# Patient Record
Sex: Female | Born: 1950 | Race: Black or African American | Hispanic: No | Smoking: Never smoker
Health system: Southern US, Community
[De-identification: ages and names within clinical notes are randomized; demographics above are authoritative.]

## PROBLEM LIST (undated history)

## (undated) DIAGNOSIS — J449 Chronic obstructive pulmonary disease, unspecified: Secondary | ICD-10-CM

## (undated) DIAGNOSIS — I10 Essential (primary) hypertension: Secondary | ICD-10-CM

## (undated) DIAGNOSIS — M1990 Unspecified osteoarthritis, unspecified site: Secondary | ICD-10-CM

## (undated) DIAGNOSIS — I509 Heart failure, unspecified: Secondary | ICD-10-CM

## (undated) DIAGNOSIS — R269 Unspecified abnormalities of gait and mobility: Secondary | ICD-10-CM

## (undated) HISTORY — PX: CHOLECYSTECTOMY: SHX55

## (undated) HISTORY — DX: Unspecified osteoarthritis, unspecified site: M19.90

## (undated) HISTORY — DX: Chronic obstructive pulmonary disease, unspecified: J44.9

## (undated) HISTORY — PX: CARDIAC SURGERY: SHX584

## (undated) HISTORY — PX: ABDOMINAL HYSTERECTOMY: SHX81

## (undated) HISTORY — PX: BACK SURGERY: SHX140

## (undated) HISTORY — PX: COLON SURGERY: SHX602

## (undated) HISTORY — DX: Unspecified abnormalities of gait and mobility: R26.9

## (undated) HISTORY — DX: Essential (primary) hypertension: I10

## (undated) HISTORY — DX: Heart failure, unspecified: I50.9

---

## 1998-06-03 ENCOUNTER — Emergency Department (HOSPITAL_COMMUNITY): Admission: EM | Admit: 1998-06-03 | Discharge: 1998-06-03 | Payer: Self-pay | Admitting: Emergency Medicine

## 2004-07-29 ENCOUNTER — Ambulatory Visit (HOSPITAL_BASED_OUTPATIENT_CLINIC_OR_DEPARTMENT_OTHER): Admission: RE | Admit: 2004-07-29 | Discharge: 2004-07-29 | Payer: Self-pay | Admitting: *Deleted

## 2004-07-29 ENCOUNTER — Ambulatory Visit (HOSPITAL_COMMUNITY): Admission: RE | Admit: 2004-07-29 | Discharge: 2004-07-29 | Payer: Self-pay | Admitting: *Deleted

## 2004-11-03 ENCOUNTER — Encounter: Admission: RE | Admit: 2004-11-03 | Discharge: 2004-11-03 | Payer: Self-pay | Admitting: Neurological Surgery

## 2004-12-10 ENCOUNTER — Inpatient Hospital Stay (HOSPITAL_COMMUNITY): Admission: RE | Admit: 2004-12-10 | Discharge: 2004-12-15 | Payer: Self-pay | Admitting: Neurological Surgery

## 2005-01-11 ENCOUNTER — Encounter: Admission: RE | Admit: 2005-01-11 | Discharge: 2005-01-11 | Payer: Self-pay | Admitting: Neurological Surgery

## 2005-03-29 ENCOUNTER — Encounter: Admission: RE | Admit: 2005-03-29 | Discharge: 2005-03-29 | Payer: Self-pay | Admitting: Neurological Surgery

## 2005-05-03 ENCOUNTER — Encounter: Admission: RE | Admit: 2005-05-03 | Discharge: 2005-05-03 | Payer: Self-pay | Admitting: Neurological Surgery

## 2005-08-09 ENCOUNTER — Encounter: Admission: RE | Admit: 2005-08-09 | Discharge: 2005-08-09 | Payer: Self-pay | Admitting: Neurological Surgery

## 2012-08-08 HISTORY — PX: JOINT REPLACEMENT: SHX530

## 2013-01-05 DIAGNOSIS — M1990 Unspecified osteoarthritis, unspecified site: Secondary | ICD-10-CM

## 2013-01-05 HISTORY — DX: Unspecified osteoarthritis, unspecified site: M19.90

## 2015-04-22 DIAGNOSIS — I509 Heart failure, unspecified: Secondary | ICD-10-CM

## 2015-04-22 HISTORY — DX: Heart failure, unspecified: I50.9

## 2015-07-05 LAB — BASIC METABOLIC PANEL
BUN: 28 mg/dL — AB (ref 4–21)
Creatinine: 0.8 mg/dL (ref <=1.1)
Glucose: 9 mg/dL
Potassium: 4.4 mmol/L (ref 3.4–5.3)
Sodium: 142 mmol/L (ref 137–147)

## 2015-07-07 LAB — POCT INR: INR: 1.4 — AB (ref <=1.1)

## 2015-07-08 LAB — CBC AND DIFFERENTIAL
HCT: 27 % — AB (ref 36–46)
Hemoglobin: 8.4 g/dL — AB (ref 12.0–16.0)
Platelets: 366 10*3/uL (ref 150–399)
WBC: 1 10^3/mL

## 2015-07-08 LAB — POCT INR: INR: 1.3 — AB (ref <=1.1)

## 2015-07-08 LAB — BASIC METABOLIC PANEL
BUN: 15 mg/dL (ref 4–21)
Creatinine: 0.8 mg/dL (ref <=1.1)
Potassium: 4.2 mmol/L (ref 3.4–5.3)
Sodium: 138 mmol/L (ref 137–147)

## 2015-07-09 LAB — POCT INR: INR: 1.5 — AB (ref <=1.1)

## 2015-07-10 LAB — CBC AND DIFFERENTIAL
HCT: 26 % — AB (ref 36–46)
Hemoglobin: 8.2 g/dL — AB (ref 12.0–16.0)
Platelets: 406 10*3/uL — AB (ref 150–399)
WBC: 9.9 10^3/mL

## 2015-07-10 LAB — POCT INR: INR: 1.6 — AB (ref <=1.1)

## 2015-07-11 LAB — CBC AND DIFFERENTIAL
HCT: 32 % — AB (ref 36–46)
Hemoglobin: 10.1 g/dL — AB (ref 12.0–16.0)
Platelets: 391 10*3/uL (ref 150–399)
WBC: 14.5 10^3/mL

## 2015-07-11 LAB — POCT INR: INR: 2 — AB (ref <=1.1)

## 2015-07-15 ENCOUNTER — Encounter: Payer: Self-pay | Admitting: *Deleted

## 2015-07-16 ENCOUNTER — Encounter: Payer: Self-pay | Admitting: Adult Health

## 2015-07-16 ENCOUNTER — Non-Acute Institutional Stay (SKILLED_NURSING_FACILITY): Payer: Medicare HMO | Admitting: Adult Health

## 2015-07-16 DIAGNOSIS — D62 Acute posthemorrhagic anemia: Secondary | ICD-10-CM

## 2015-07-16 DIAGNOSIS — I480 Paroxysmal atrial fibrillation: Secondary | ICD-10-CM | POA: Diagnosis not present

## 2015-07-16 DIAGNOSIS — J449 Chronic obstructive pulmonary disease, unspecified: Secondary | ICD-10-CM | POA: Diagnosis not present

## 2015-07-16 DIAGNOSIS — I340 Nonrheumatic mitral (valve) insufficiency: Secondary | ICD-10-CM | POA: Diagnosis not present

## 2015-07-16 DIAGNOSIS — I4891 Unspecified atrial fibrillation: Secondary | ICD-10-CM | POA: Insufficient documentation

## 2015-07-16 DIAGNOSIS — E876 Hypokalemia: Secondary | ICD-10-CM

## 2015-07-16 DIAGNOSIS — I5022 Chronic systolic (congestive) heart failure: Secondary | ICD-10-CM

## 2015-07-16 DIAGNOSIS — I10 Essential (primary) hypertension: Secondary | ICD-10-CM | POA: Diagnosis not present

## 2015-07-16 NOTE — Progress Notes (Signed)
Patient ID: Shannon Mcclain, female   DOB: [birth year], 64 y.o.   MRN: [id number]    DATE:  07/16/2015 MRN:  [id number]  BIRTHDAY: 02/10/1951  Facility:  Nursing Home Location:  [facility]  Nursing Home Room Number: 902-P  LEVEL OF CARE:  SNF (31)  Contact Information    Name Relation Home Work Mobile   Carroll Sage  [phone]        Chief Complaint  Patient presents with  . Hospitalization Follow-up    Physical deconditioning, S/P mitral valve replacement, atrial fibrillation, hypertension, anemia, hypokalemia, CHF and COPD    HISTORY OF PRESENT ILLNESS:  This is a 64 year old female who has been admitted to [facility] on 07/15/15 from [facility]. She had mitral valve replacement with a tissue valve on 06/30/15. She uses O2 at home at 2 L. She had rate-controlled atrial fibrillation on postoperative day 2. She had bradycardia on postop day 3 requiring been particular pacing with a temporary pacemaker. Cardiology recommended cardioversion and was done on 7/22. It was initially successful but she is even 20 converted back to a rate controlled atrial fibrillation. She then converted to intermittent atrial fibrillation with some bigeminy and eventually remained sinus rhythm.  She has been admitted for a short-term rehabilitation.  PAST MEDICAL HISTORY:  Past Medical History  Diagnosis Date  . Abnormality of gait   . COPD (chronic obstructive pulmonary disease)   . Hypertension   . Arthritis 01/05/2013    left  . CHF (congestive heart failure) 04/22/2015    NYHA class II, chronic, systolic     CURRENT MEDICATIONS: Reviewed  Patient's Medications  New Prescriptions   No medications on file  Previous Medications   AMIODARONE (PACERONE) 200 MG TABLET    Take 200 mg by mouth every 12 (twelve) hours.   ASPIRIN EC 81 MG TABLET    Take 81 mg by mouth daily.   CARVEDILOL (COREG) 12.5 MG TABLET    Take 12.5 mg by mouth 2 (two) times daily with a meal.   FAMOTIDINE (PEPCID) 20 MG TABLET    Take 20 mg by mouth 2 (two) times daily.   FLUTICASONE FUROATE-VILANTEROL 100-25 MCG/INH AEPB    Inhale 1 puff into the lungs daily.   FUROSEMIDE (LASIX) 20 MG TABLET    Take 20 mg by mouth daily.   LOSARTAN (COZAAR) 50 MG TABLET    Take 50 mg by mouth 2 (two) times daily.   OXYCODONE (OXY IR/ROXICODONE) 5 MG IMMEDIATE RELEASE TABLET    Take 5 mg by mouth every 4 (four) hours as needed for severe pain.   POTASSIUM CHLORIDE (K-DUR,KLOR-CON) 10 MEQ TABLET    Take 10 mEq by mouth daily.   WARFARIN (COUMADIN) 5 MG TABLET    Take 2.5 mg by mouth daily.   Modified Medications   No medications on file  Discontinued Medications   AMIODARONE (PACERONE) 400 MG TABLET    Take 200 mg by mouth every 12 (twelve) hours.    ASPIRIN 325 MG TABLET    Take 325 mg by mouth daily.   BISACODYL (DULCOLAX) 10 MG SUPPOSITORY    Place 10 mg rectally daily as needed for moderate constipation.   DOCUSATE SODIUM (COLACE) 100 MG CAPSULE    Take 100 mg by mouth 2 (two) times daily.   ENOXAPARIN (LOVENOX) 80 MG/0.8ML INJECTION    Inject 80 mg into the skin every 12 (twelve) hours.   FUROSEMIDE (LASIX) 40 MG TABLET  Take 20 mg by mouth daily.    MAGNESIUM CITRATE PO    Take 150 mLs by mouth daily.   POTASSIUM CHLORIDE (KLOR-CON) 20 MEQ PACKET    Take 10 mEq by mouth daily.    SODIUM CHLORIDE IRRIGATION 0.9 % IRRIGATION    Irrigate with 10 mLs as directed once.     Allergies  Allergen Reactions  . Codeine   . Penicillins   . Sulfa Antibiotics      REVIEW OF SYSTEMS:  GENERAL: no change in appetite, no fatigue, no weight changes, no fever, chills or weakness EYES: Denies change in vision, dry eyes, eye pain, itching or discharge EARS: Denies change in hearing, ringing in ears, or earache NOSE: Denies nasal congestion or epistaxis MOUTH and THROAT: Denies oral discomfort, gingival pain or bleeding, pain from teeth or hoarseness   RESPIRATORY: no cough, SOB, DOE, wheezing,  hemoptysis CARDIAC: no chest pain, edema or palpitations GI: no abdominal pain, diarrhea, constipation, heart burn, nausea or vomiting GU: Denies dysuria, frequency, hematuria, incontinence, or discharge PSYTCHIATRIC: Denies feeling of depression or anxiety. No report of hallucinations, insomnia, paranoia, or agitation   PHYSICAL EXAMINATION  GENERAL APPEARANCE: Well nourished. In no acute distress. Normal body habitus SKIN:   Surgical incision on midline chest is dry, no redness HEAD: Normal in size and contour. No evidence of trauma EYES: Lids open and close normally. No blepharitis, entropion or ectropion. PERRL. Conjunctivae are clear and sclerae are white. Lenses are without opacity EARS: Pinnae are normal. Patient hears normal voice tunes of the examiner MOUTH and THROAT: Lips are without lesions. Oral mucosa is moist and without lesions. Tongue is normal in shape, size, and color and without lesions NECK: supple, trachea midline, no neck masses, no thyroid tenderness, no thyromegaly LYMPHATICS: no LAN in the neck, no supraclavicular LAN RESPIRATORY: breathing is even & unlabored, BS CTAB CARDIAC: RRR, no murmur,no extra heart sounds, no edema GI: abdomen soft, normal BS, no masses, no tenderness, no hepatomegaly, no splenomegaly EXTREMITIES:  Able to move 4 extremities PSYCHIATRIC: Alert and oriented X 3. Affect and behavior are appropriate  LABS/RADIOLOGY: Labs reviewed: Basic Metabolic Panel:  Recent Labs  16/10/96 07/08/15  NA 142 138  K 4.4 4.2  BUN 28* 15  CREATININE 0.8 0.8   CBC:  Recent Labs  07/08/15 07/10/15 07/11/15  WBC 1.0 9.9 14.5  HGB 8.4* 8.2* 10.1*  HCT 27* 26* 32*  PLT 366 406* 391    ASSESSMENT/PLAN:  Physical deconditioning - for rehabilitation  Mitral regurgitation S/P mitral valve replacement - continue oxycodone 5 mg 1 tab by mouth every 4 hours when necessary for pain; follow-up with Dr. Gwen Her, [facility]  [facility])  Atrial fibrillation - rate controlled; continue amiodarone 200 mg 1 tab by mouth every 12 hours, Coreg 12.5 mg by mouth twice a day and Coumadin  Hypertension - well controlled; continue Coreg 12.5 mg 1 tab by mouth twice a day and Cozaar 50 mg 1 tab by mouth daily  Acute blood loss anemia - hemoglobin 10.1; check CBC  Hypokalemia - K4.2; continue KCl 1 tab by mouth daily  Chronic systolic heart failure - continue Lasix 20 mg 1 tab by mouth daily  COPD - continue Symbicort 180-4.5 g/ACT 2 puffs into lungs twice a day    Goals of care:  Short-term rehabilitation   [facility], NP [facility] [phone]

## 2015-07-17 ENCOUNTER — Encounter (HOSPITAL_COMMUNITY): Payer: Self-pay | Admitting: Emergency Medicine

## 2015-07-17 ENCOUNTER — Emergency Department (HOSPITAL_COMMUNITY)
Admission: EM | Admit: 2015-07-17 | Discharge: 2015-07-17 | Disposition: A | Discharge status: Home / Self Care | Payer: Medicare HMO | Attending: Physician Assistant | Admitting: Physician Assistant

## 2015-07-17 ENCOUNTER — Emergency Department (HOSPITAL_COMMUNITY): Payer: Medicare HMO

## 2015-07-17 DIAGNOSIS — Z79899 Other long term (current) drug therapy: Secondary | ICD-10-CM | POA: Insufficient documentation

## 2015-07-17 DIAGNOSIS — Z7982 Long term (current) use of aspirin: Secondary | ICD-10-CM | POA: Insufficient documentation

## 2015-07-17 DIAGNOSIS — R519 Headache, unspecified: Secondary | ICD-10-CM

## 2015-07-17 DIAGNOSIS — H538 Other visual disturbances: Secondary | ICD-10-CM | POA: Diagnosis not present

## 2015-07-17 DIAGNOSIS — M1990 Unspecified osteoarthritis, unspecified site: Secondary | ICD-10-CM | POA: Insufficient documentation

## 2015-07-17 DIAGNOSIS — Z7901 Long term (current) use of anticoagulants: Secondary | ICD-10-CM | POA: Diagnosis not present

## 2015-07-17 DIAGNOSIS — Z880 Allergy status to penicillin: Secondary | ICD-10-CM | POA: Insufficient documentation

## 2015-07-17 DIAGNOSIS — I10 Essential (primary) hypertension: Secondary | ICD-10-CM | POA: Diagnosis not present

## 2015-07-17 DIAGNOSIS — Z7951 Long term (current) use of inhaled steroids: Secondary | ICD-10-CM | POA: Diagnosis not present

## 2015-07-17 DIAGNOSIS — R51 Headache: Secondary | ICD-10-CM | POA: Diagnosis present

## 2015-07-17 DIAGNOSIS — J449 Chronic obstructive pulmonary disease, unspecified: Secondary | ICD-10-CM | POA: Insufficient documentation

## 2015-07-17 DIAGNOSIS — R110 Nausea: Secondary | ICD-10-CM | POA: Diagnosis not present

## 2015-07-17 DIAGNOSIS — I509 Heart failure, unspecified: Secondary | ICD-10-CM | POA: Diagnosis not present

## 2015-07-17 LAB — URINALYSIS, ROUTINE W REFLEX MICROSCOPIC
Bilirubin Urine: NEGATIVE
Glucose, UA: NEGATIVE mg/dL
Hgb urine dipstick: NEGATIVE
KETONES UR: NEGATIVE mg/dL
Nitrite: NEGATIVE
Protein, ur: 30 mg/dL — AB
Specific Gravity, Urine: 1.019 (ref 1.005–1.030)
UROBILINOGEN UA: 0.2 mg/dL (ref 0.0–1.0)
pH: 5.5 (ref 5.0–8.0)

## 2015-07-17 LAB — BASIC METABOLIC PANEL
Anion gap: 7 (ref 5–15)
BUN: 20 mg/dL (ref 6–20)
CO2: 24 mmol/L (ref 22–32)
CREATININE: 0.93 mg/dL (ref 0.44–1.00)
Calcium: 9 mg/dL (ref 8.9–10.3)
Chloride: 108 mmol/L (ref 101–111)
GFR calc Af Amer: >60 mL/min (ref >60)
Glucose, Bld: 88 mg/dL (ref 65–99)
Potassium: 4.8 mmol/L (ref 3.5–5.1)
Sodium: 139 mmol/L (ref 135–145)

## 2015-07-17 LAB — CBC
HCT: 34.4 % — ABNORMAL LOW (ref 36.0–46.0)
Hemoglobin: 10.8 g/dL — ABNORMAL LOW (ref 12.0–15.0)
MCH: 25.7 pg — ABNORMAL LOW (ref 26.0–34.0)
MCHC: 31.4 g/dL (ref 30.0–36.0)
MCV: 81.7 fL (ref 78.0–100.0)
Platelets: 437 10*3/uL — ABNORMAL HIGH (ref 150–400)
RBC: 4.21 MIL/uL (ref 3.87–5.11)
RDW: 18.7 % — ABNORMAL HIGH (ref 11.5–15.5)
WBC: 8.6 10*3/uL (ref 4.0–10.5)

## 2015-07-17 LAB — I-STAT CHEM 8, ED
BUN: 26 mg/dL — ABNORMAL HIGH (ref 6–20)
CALCIUM ION: 1.2 mmol/L (ref 1.13–1.30)
CHLORIDE: 104 mmol/L (ref 101–111)
Creatinine, Ser: 0.9 mg/dL (ref 0.44–1.00)
GLUCOSE: 85 mg/dL (ref 65–99)
HCT: 36 % (ref 36.0–46.0)
HEMOGLOBIN: 12.2 g/dL (ref 12.0–15.0)
Potassium: 4.6 mmol/L (ref 3.5–5.1)
Sodium: 140 mmol/L (ref 135–145)
TCO2: 26 mmol/L (ref 0–100)

## 2015-07-17 LAB — SEDIMENTATION RATE: SED RATE: 33 mm/h — AB (ref 0–22)

## 2015-07-17 LAB — C-REACTIVE PROTEIN: CRP: 1.8 mg/dL — ABNORMAL HIGH (ref <1.0)

## 2015-07-17 LAB — PROTIME-INR
INR: 1.7 — AB (ref 0.00–1.49)
Prothrombin Time: 20 seconds — ABNORMAL HIGH (ref 11.6–15.2)

## 2015-07-17 LAB — URINE MICROSCOPIC-ADD ON

## 2015-07-17 MED ORDER — IOHEXOL 350 MG/ML SOLN
50.0000 mL | Freq: Once | INTRAVENOUS | Status: AC | PRN
  Administered 2015-07-17: 50 mL via INTRAVENOUS

## 2015-07-17 MED ORDER — FENTANYL CITRATE (PF) 100 MCG/2ML IJ SOLN
100.0000 ug | Freq: Once | INTRAMUSCULAR | Status: AC
  Administered 2015-07-17: 100 ug via INTRAVENOUS
  Filled 2015-07-17: qty 2

## 2015-07-17 MED ORDER — ONDANSETRON HCL 4 MG/2ML IJ SOLN
4.0000 mg | Freq: Once | INTRAMUSCULAR | Status: AC
  Administered 2015-07-17: 4 mg via INTRAVENOUS
  Filled 2015-07-17: qty 2

## 2015-07-17 MED ORDER — AMIODARONE HCL 200 MG PO TABS: 200.0000 mg | ORAL_TABLET | Freq: Once | ORAL | Status: DC

## 2015-07-17 MED ORDER — CARVEDILOL 12.5 MG PO TABS: 12.5000 mg | ORAL_TABLET | Freq: Two times a day (BID) | ORAL | Status: DC

## 2015-07-17 MED ORDER — SODIUM CHLORIDE 0.9 % IV BOLUS (SEPSIS)
500.0000 mL | Freq: Once | INTRAVENOUS | Status: AC
  Administered 2015-07-17: 500 mL via INTRAVENOUS

## 2015-07-17 NOTE — Discharge Instructions (Signed)
You are having a headache. No specific cause was found today for your headache. It may have been a migraine or other cause of headache. Stress, anxiety, fatigue, and depression are common triggers for headaches. Your headache today does not appear to be life-threatening or require hospitalization, but often the exact cause of headaches is not determined in the emergency department. Therefore, follow-up with your doctor is very important to find out what may have caused your headache, and whether or not you need any further diagnostic testing or treatment. Sometimes headaches can appear benign (not harmful), but then more serious symptoms can develop which should prompt an immediate re-evaluation by your doctor or the emergency department. SEEK MEDICAL ATTENTION IF: You develop possible problems with medications prescribed.  The medications don't resolve your headache, if it recurs , or if you have multiple episodes of vomiting or can't take fluids. You have a change from the usual headache. RETURN IMMEDIATELY IF you develop a sudden, severe headache or confusion, become poorly responsive or faint, develop a fever above 100.32F or problem breathing, have a change in speech, vision, swallowing, or understanding, or develop new weakness, numbness, tingling, incoordination, or have a seizure.    Hypertension Hypertension, commonly called high blood pressure, is when the force of blood pumping through your arteries is too strong. Your arteries are the blood vessels that carry blood from your heart throughout your body. A blood pressure reading consists of a higher number over a lower number, such as 110/72. The higher number (systolic) is the pressure inside your arteries when your heart pumps. The lower number (diastolic) is the pressure inside your arteries when your heart relaxes. Ideally you want your blood pressure below 120/80. Hypertension forces your heart to work harder to pump blood. Your arteries may  become narrow or stiff. Having hypertension puts you at risk for heart disease, stroke, and other problems.  RISK FACTORS Some risk factors for high blood pressure are controllable. Others are not.  Risk factors you cannot control include:   Race. You may be at higher risk if you are African American.  Age. Risk increases with age.  Gender. Men are at higher risk than women before age 33 years. After age 59, women are at higher risk than men. Risk factors you can control include:  Not getting enough exercise or physical activity.  Being overweight.  Getting too much fat, sugar, calories, or salt in your diet.  Drinking too much alcohol. SIGNS AND SYMPTOMS Hypertension does not usually cause signs or symptoms. Extremely high blood pressure (hypertensive crisis) may cause headache, anxiety, shortness of breath, and nosebleed. DIAGNOSIS  To check if you have hypertension, your health care provider will measure your blood pressure while you are seated, with your arm held at the level of your heart. It should be measured at least twice using the same arm. Certain conditions can cause a difference in blood pressure between your right and left arms. A blood pressure reading that is higher than normal on one occasion does not mean that you need treatment. If one blood pressure reading is high, ask your health care provider about having it checked again. TREATMENT  Treating high blood pressure includes making lifestyle changes and possibly taking medicine. Living a healthy lifestyle can help lower high blood pressure. You may need to change some of your habits. Lifestyle changes may include:  Following the DASH diet. This diet is high in fruits, vegetables, and whole grains. It is low in salt, red  meat, and added sugars.  Getting at least 2 hours of brisk physical activity every week.  Losing weight if necessary.  Not smoking.  Limiting alcoholic beverages.  Learning ways to reduce  stress. If lifestyle changes are not enough to get your blood pressure under control, your health care provider may prescribe medicine. You may need to take more than one. Work closely with your health care provider to understand the risks and benefits. HOME CARE INSTRUCTIONS  Have your blood pressure rechecked as directed by your health care provider.   Take medicines only as directed by your health care provider. Follow the directions carefully. Blood pressure medicines must be taken as prescribed. The medicine does not work as well when you skip doses. Skipping doses also puts you at risk for problems.   Do not smoke.   Monitor your blood pressure at home as directed by your health care provider. SEEK MEDICAL CARE IF:   You think you are having a reaction to medicines taken.  You have recurrent headaches or feel dizzy.  You have swelling in your ankles.  You have trouble with your vision. SEEK IMMEDIATE MEDICAL CARE IF:  You develop a severe headache or confusion.  You have unusual weakness, numbness, or feel faint.  You have severe chest or abdominal pain.  You vomit repeatedly.  You have trouble breathing. MAKE SURE YOU:   Understand these instructions.  Will watch your condition.  Will get help right away if you are not doing well or get worse. Document Released: 11/29/2005 Document Revised: 04/15/2014 Document Reviewed: 09/21/2013 [facility] Patient Information 2015 [place], [place]. This information is not intended to replace advice given to you by your health care provider. Make sure you discuss any questions you have with your health care provider.  General Headache Without Cause A headache is pain or discomfort felt around the head or neck area. The specific cause of a headache may not be found. There are many causes and types of headaches. A few common ones are:  Tension headaches.  Migraine headaches.  Cluster headaches.  Chronic daily headaches. HOME  CARE INSTRUCTIONS   Keep all follow-up appointments with your caregiver or any specialist referral.  Only take over-the-counter or prescription medicines for pain or discomfort as directed by your caregiver.  Lie down in a dark, quiet room when you have a headache.  Keep a headache journal to find out what may trigger your migraine headaches. For example, write down:  What you eat and drink.  How much sleep you get.  Any change to your diet or medicines.  Try massage or other relaxation techniques.  Put ice packs or heat on the head and neck. Use these 3 to 4 times per day for 15 to 20 minutes each time, or as needed.  Limit stress.  Sit up straight, and do not tense your muscles.  Quit smoking if you smoke.  Limit alcohol use.  Decrease the amount of caffeine you drink, or stop drinking caffeine.  Eat and sleep on a regular schedule.  Get 7 to 9 hours of sleep, or as recommended by your caregiver.  Keep lights dim if bright lights bother you and make your headaches worse. SEEK MEDICAL CARE IF:   You have problems with the medicines you were prescribed.  Your medicines are not working.  You have a change from the usual headache.  You have nausea or vomiting. SEEK IMMEDIATE MEDICAL CARE IF:   Your headache becomes severe.  You have  a fever.  You have a stiff neck.  You have loss of vision.  You have muscular weakness or loss of muscle control.  You start losing your balance or have trouble walking.  You feel faint or pass out.  You have severe symptoms that are different from your first symptoms. MAKE SURE YOU:   Understand these instructions.  Will watch your condition.  Will get help right away if you are not doing well or get worse. Document Released: 11/29/2005 Document Revised: 02/21/2012 Document Reviewed: 12/15/2011 [facility] Patient Information 2015 [place], [place]. This information is not intended to replace advice given to you by your  health care provider. Make sure you discuss any questions you have with your health care provider.

## 2015-07-17 NOTE — ED Provider Notes (Signed)
CSN: [id number]     Arrival date & time 07/17/15  0815 History   First MD Initiated Contact with Patient 07/17/15 [phone]     Chief Complaint  Patient presents with  . Headache     (Consider location/radiation/quality/duration/timing/severity/associated sxs/prior Treatment) HPI Comments: Shannon Mcclain, 64 y/o female with history of mitral valve replacement, atrial fibrillation, HTN, CHF, and COPD presents with headache. Her headache woke her up at 4 am this morning. The pain is sharp, originating from near her left temple, and shoots up across the left half of her forehead. It is constant and is a 7/10. She has blurriness and decreased vision of her left eye along with photophobia. She states her heart was racing when she woke at 4 am with her headache, but she feels fine now. She was nauseas at 4 am when she woke, but was successfully treated for her nausea by EMS. This is not the worst headache of her life, but it is different from any other headache she has had. She denies vomiting, blood in urine or stool, fevers, dizziness, pain with movement of jaw.  Patient is a 64 y.o. female presenting with headaches. The history is provided by the patient.  Headache Quality:  Sharp Duration:  6 hours Chronicity:  New Similar to prior headaches: no   Associated symptoms: nausea and photophobia     Past Medical History  Diagnosis Date  . Abnormality of gait   . COPD (chronic obstructive pulmonary disease)   . Hypertension   . Arthritis 01/05/2013    left  . CHF (congestive heart failure) 04/22/2015    NYHA class II, chronic, systolic   Past Surgical History  Procedure Laterality Date  . Joint replacement  08/08/2012  . Cholecystectomy    . Abdominal hysterectomy    . Colon surgery    . Cardiac surgery     History reviewed. No pertinent family history. History  Substance Use Topics  . Smoking status: Never Smoker   . Smokeless tobacco: Not on file  . Alcohol Use: No   OB History    No  data available     Review of Systems  Eyes: Positive for photophobia and visual disturbance.  Gastrointestinal: Positive for nausea.  Neurological: Positive for headaches.  All other systems reviewed and are negative.     Allergies  Codeine; Penicillins; and Sulfa antibiotics  Home Medications   Prior to Admission medications   Medication Sig Start Date End Date Taking? Authorizing Provider  amiodarone (PACERONE) 200 MG tablet Take 200 mg by mouth every 12 (twelve) hours.    Historical Provider, MD  aspirin EC 81 MG tablet Take 81 mg by mouth daily.    Historical Provider, MD  carvedilol (COREG) 12.5 MG tablet Take 12.5 mg by mouth 2 (two) times daily with a meal.    Historical Provider, MD  famotidine (PEPCID) 20 MG tablet Take 20 mg by mouth 2 (two) times daily.    Historical Provider, MD  Fluticasone Furoate-Vilanterol 100-25 MCG/INH AEPB Inhale 1 puff into the lungs daily.    Historical Provider, MD  furosemide (LASIX) 20 MG tablet Take 20 mg by mouth daily.    Historical Provider, MD  losartan (COZAAR) 50 MG tablet Take 50 mg by mouth 2 (two) times daily.    Historical Provider, MD  oxyCODONE (OXY IR/ROXICODONE) 5 MG immediate release tablet Take 5 mg by mouth every 4 (four) hours as needed for severe pain.    Historical Provider, MD  potassium  chloride (K-DUR,KLOR-CON) 10 MEQ tablet Take 10 mEq by mouth daily.    Historical Provider, MD  warfarin (COUMADIN) 5 MG tablet Take 2.5 mg by mouth daily.     Historical Provider, MD   BP 152/86 mmHg  Pulse 32  Temp(Src) 98.5 F (36.9 C) (Oral)  Resp 26  Ht 5\' 4"  (1.626 m)  Wt 177 lb (80.287 kg)  BMI 30.37 kg/m2  SpO2 94% Physical Exam  Constitutional: She is oriented to person, place, and time. She appears well-developed and well-nourished. No distress.  HENT:  Head: Normocephalic.    Eyes: Conjunctivae and EOM are normal. Pupils are equal, round, and reactive to light.  Cardiovascular: Normal rate, regular rhythm and  normal heart sounds.   Pulmonary/Chest: Effort normal and breath sounds normal. She has no wheezes. She has no rales.  Abdominal: Soft. She exhibits no distension. There is tenderness.  Musculoskeletal: She exhibits no edema or tenderness.  Neurological: She is alert and oriented to person, place, and time. She has normal strength. No cranial nerve deficit. She exhibits normal muscle tone. Coordination normal.  Skin: She is not diaphoretic.  Psychiatric: She has a normal mood and affect.    ED Course  Procedures (including critical care time) Labs Review Labs Reviewed - No data to display  Imaging Review No results found.   EKG Interpretation None      MDM   Final diagnoses:  Head ache  Headache  Bad headache  Essential hypertension    Patient labs, imaging without acute abnormality. BP has trended down.  Pain is will controlled. Urine appears contaminated. Sed rate/CRP mildly elevated likely s/p thoracic surgery. Patient HA resolved.   Pt is afebrile with no focal neuro deficits, nuchal rigidity, or change in vision. Pt verbalizes understanding and is agreeable with plan to dc.     Arthor Captain, PA-C 07/17/15 1541  Courteney Randall An, MD 07/21/15 [phone]

## 2015-07-17 NOTE — ED Notes (Signed)
Patient transported to CT by [facility].

## 2015-07-17 NOTE — ED Notes (Signed)
Pt alert, no neuro deficits noted.  Family at bedside.  Denies visual disturbance. +Photophobia

## 2015-07-17 NOTE — ED Notes (Signed)
Pt in rehab s/p mitral valve replacement 06/30/15.  Woke at 0400 with left sided headache unrelieved with aspirin.  No neuro deficits noted

## 2015-07-17 NOTE — ED Notes (Signed)
Pt assisted with use of bedpan

## 2015-07-17 NOTE — ED Notes (Signed)
Urine specimen at bedside

## 2015-07-17 NOTE — ED Notes (Signed)
Attempted report to [facility].

## 2015-07-17 NOTE — ED Notes (Signed)
Pt monitored by pulse ox, bp cuff, and 5-lead. 

## 2015-07-18 ENCOUNTER — Non-Acute Institutional Stay (SKILLED_NURSING_FACILITY): Payer: Medicare HMO | Admitting: Internal Medicine

## 2015-07-18 DIAGNOSIS — I480 Paroxysmal atrial fibrillation: Secondary | ICD-10-CM

## 2015-07-18 DIAGNOSIS — I340 Nonrheumatic mitral (valve) insufficiency: Secondary | ICD-10-CM | POA: Diagnosis not present

## 2015-07-18 DIAGNOSIS — D62 Acute posthemorrhagic anemia: Secondary | ICD-10-CM | POA: Diagnosis not present

## 2015-07-18 DIAGNOSIS — J449 Chronic obstructive pulmonary disease, unspecified: Secondary | ICD-10-CM | POA: Diagnosis not present

## 2015-07-18 DIAGNOSIS — R5381 Other malaise: Secondary | ICD-10-CM

## 2015-07-18 DIAGNOSIS — I5022 Chronic systolic (congestive) heart failure: Secondary | ICD-10-CM | POA: Diagnosis not present

## 2015-07-18 DIAGNOSIS — K5901 Slow transit constipation: Secondary | ICD-10-CM

## 2015-07-18 DIAGNOSIS — I10 Essential (primary) hypertension: Secondary | ICD-10-CM

## 2015-07-18 NOTE — Progress Notes (Signed)
Patient ID: Shannon Mcclain, female   DOB: 06-16-1951, 64 y.o.   MRN: [id number]     [facility]   PCP: No primary care provider on file.  Code Status: full code  Allergies  Allergen Reactions  . Codeine   . Penicillins   . Sulfa Antibiotics     Chief Complaint  Patient presents with  . New Admit To SNF     HPI:  64 y.o. patient is here for short term rehabilitation post hospital admission from 06/30/15-07/15/15 with mitral valve replacement with a tissue valve on 06/30/15. Post op she went into afib and then had bradycardia. She required temporary pacemaker and then cardioversion. She is seen in her room today. As per staff her bp has been elevated with DBP in 90-110 and her HR has been in 50s. Patient feels her heart skipping beat and has chest soreness around surgical area but denies chest pain. She is on o2 and has dyspnea on exertion. She feels tired at present. Denies any other concerns. She has PMH of afib, chronic systolic heart failure, HTN, COPD among others. She was sent to ED yesterday for headache and elevated BP reading. Ct head ruled out acute bleed/ infarct.   Review of Systems:  Constitutional: Negative for fever, chills, diaphoresis.  HENT: Negative for headache, congestion, nasal discharge Eyes: Negative for eye pain, blurred vision, double vision and discharge.  Respiratory: Negative for cough, shortness of breath and wheezing.   Cardiovascular: Negative for chest pain, palpitations, leg swelling.  Gastrointestinal: Negative for heartburn, nausea, vomiting, abdominal pain. Last bowel movement on Wednesday Genitourinary: Negative for dysuria, flank pain.  Musculoskeletal: Negative for back pain, falls Skin: Negative for itching, rash.  Neurological: Negative for dizziness, tingling, focal weakness Psychiatric/Behavioral: Negative for depression   Past Medical History  Diagnosis Date  . Abnormality of gait   . COPD (chronic  obstructive pulmonary disease)   . Hypertension   . Arthritis 01/05/2013    left  . CHF (congestive heart failure) 04/22/2015    NYHA class II, chronic, systolic   Past Surgical History  Procedure Laterality Date  . Joint replacement  08/08/2012  . Cholecystectomy    . Abdominal hysterectomy    . Colon surgery    . Cardiac surgery     Social History:   reports that she has never smoked. She does not have any smokeless tobacco history on file. She reports that she does not drink alcohol or use illicit drugs.  No family history on file.  Medications:   Medication List       This list is accurate as of: 07/18/15  2:23 PM.  Always use your most recent med list.               amiodarone 400 MG tablet  Commonly known as:  PACERONE  Take 200 mg by mouth 2 (two) times daily.     aspirin 81 MG tablet  Take 81 mg by mouth daily.     budesonide-formoterol 160-4.5 MCG/ACT inhaler  Commonly known as:  SYMBICORT  Inhale 2 puffs into the lungs 2 (two) times daily.     carvedilol 12.5 MG tablet  Commonly known as:  COREG  Take 12.5 mg by mouth 2 (two) times daily with a meal.     famotidine 20 MG tablet  Commonly known as:  PEPCID  Take 20 mg by mouth 2 (two) times daily.     furosemide 20 MG tablet  Commonly known as:  LASIX  Take 20 mg by mouth daily.     losartan 50 MG tablet  Commonly known as:  COZAAR  Take 50 mg by mouth daily.     oxyCODONE 5 MG immediate release tablet  Commonly known as:  Oxy IR/ROXICODONE  Take 5 mg by mouth every 4 (four) hours as needed for severe pain.     potassium chloride SA 20 MEQ tablet  Commonly known as:  K-DUR,KLOR-CON  Take 10 mEq by mouth daily.     warfarin 5 MG tablet  Commonly known as:  COUMADIN  Take 5 mg by mouth daily.         Physical Exam: Filed Vitals:   07/18/15 1348  BP: 181/79  Pulse: 54  Temp: 98.8 F (37.1 C)  Resp: 18  Height: 5\' 2"  (1.575 m)  Weight: 177 lb 6.4 oz (80.468 kg)  SpO2: 99%     General- elderly female,  in no acute distress Head- normocephalic, atraumatic Throat- moist mucus membrane Eyes- PERRLA, EOMI, no pallor, no icterus, no discharge, normal conjunctiva, normal sclera Neck- no cervical lymphadenopathy, no jugular vein distension Cardiovascular- irregular heart rate, no murmurs, palpable dorsalis pedis and radial pulses, trace leg edema Respiratory- bilateral clear to auscultation, no wheeze, no rhonchi, no crackles, no use of accessory muscles Abdomen- bowel sounds present, soft, non tender Musculoskeletal- able to move all 4 extremities, generalized weakness Neurological- no focal deficit, alert and oriented to person, place and time Skin- warm and dry, sternal incision healing well, abdominal incision healing well Psychiatry- normal mood and affect    Labs reviewed: Basic Metabolic Panel:  Recent Labs  40/98/11 07/17/15 1015 07/17/15 1024  NA 138 139 140  K 4.2 4.8 4.6  CL  --  108 104  CO2  --  24  --   GLUCOSE  --  88 85  BUN 15 20 26*  CREATININE 0.8 0.93 0.90  CALCIUM  --  9.0  --    CBC:  Recent Labs  07/10/15 07/11/15 07/17/15 1015 07/17/15 1024  WBC 9.9 14.5 8.6  --   HGB 8.2* 10.1* 10.8* 12.2  HCT 26* 32* 34.4* 36.0  MCV  --   --  81.7  --   PLT 406* 391 437*  --     Radiological Exams: Ct Angio Head W/cm &/or Wo Cm  07/17/2015   CLINICAL DATA:  64 year old female who awoke with left frontal headache and left side blurred vision. Cardiac surgery in July. Atrial fibrillation. Initial encounter.  EXAM: CT ANGIOGRAPHY HEAD  TECHNIQUE: Multidetector CT imaging of the head was performed using the standard protocol during bolus administration of intravenous contrast. Multiplanar CT image reconstructions and MIPs were obtained to evaluate the vascular anatomy.  CONTRAST:  57mL OMNIPAQUE IOHEXOL 350 MG/ML SOLN  COMPARISON:  [facility] head CT without contrast report 04/10/1998 (no images available).  FINDINGS: CT  HEAD  Brain: Cerebral volume is within normal limits for age. No ventriculomegaly. No midline shift, mass effect, or evidence of intracranial mass lesion. Patchy and confluent bilateral cerebral white matter hypodensity. No evidence of cortically based acute infarction identified. No acute intracranial hemorrhage identified. Deep gray matter and posterior fossa gray-white matter differentiation is within normal limits.  Calvarium and skull base:  No acute osseous abnormality identified.  Paranasal sinuses: Visualized paranasal sinuses and mastoids are clear.  Orbits: Visualized orbits and scalp soft tissues are within normal limits.  CTA HEAD  Posterior circulation: Fairly  codominant distal vertebral arteries. Both PICA origins appear patent. Patent vertebrobasilar junction. No basilar artery stenosis. SCA and right PCA origins are within normal limits. Fetal type left PCA origin. Right posterior communicating artery is present. Bilateral PCA branches are within normal limits.  Anterior circulation: Both ICA siphons are patent. No siphon atherosclerosis or stenosis identified. Ophthalmic and posterior communicating artery origins are within normal limits. Carotid termini are patent. MCA and ACA origins are within normal limits.  Dominant right ACA A1 segment. Anterior communicating artery and bilateral ACA branches are within normal limits. Left MCA M1 segment, bifurcation, and left MCA branches are within normal limits. Right MCA M1 segment, bifurcation, and right MCA branches are within normal limits.  Venous sinuses: Patent.  Anatomic variants: Fetal type left PCA origin.  Delayed phase:No abnormal enhancement identified.  IMPRESSION: 1. Negative intracranial CTA. 2. Moderate nonspecific cerebral white matter changes, most commonly due to chronic small vessel disease. No acute intracranial abnormality.   Electronically Signed   By: Odessa Fleming M.D.   On: 07/17/2015 11:30    Assessment/Plan  Physical  deconditioning Will have her work with physical therapy and occupational therapy team to help with gait training and muscle strengthening exercises.fall precautions. Skin care. Encourage to be out of bed.   Mitral regurgitation  S/P mitral valve replacement. Surgical incision healing well. Has follow up with Dr Wynonia Hazard. continue oxycodone 5 mg q4h prn chest soreness. Continue aspirin 81 mg daily  Atrial fibrillation Rate controlled but with HR in 50s. Change coreg to 6.25 mg twice daily and continue amiodarone 200 mg bid. Continue coumadin- inr today 1.7. Increase coumadin to 4 mg daily and recheck in r8/8/16. Gal inr 2-3  Hypertension Elevated BP reading. Continue cozaar 50 mg daily and add hydralazine 10 mg tid for now. Reduced coreg to 6.25 mg daily. Monitor bp q shift.  Acute blood loss anemia Post op, stable, monitor h&h. Hb 07/17/15 12.2  Constipation Add colace 100 mg bid and monitor  Chronic systolic heart failure  euvolemic on exam. Continue coreg, cozaar and lasix 20 mg daily with kcl supplement and monitor bmp  COPD Continue o2, continue Symbicort bid and monitor   Goals of care: short term rehabilitation   Labs/tests ordered:   Family/ staff Communication: reviewed care plan with patient and nursing supervisor    Oneal Grout, MD  [facility] [phone] (Monday-Friday 8 am - 5 pm) [phone] (afterhours)

## 2015-07-21 ENCOUNTER — Encounter: Payer: Self-pay | Admitting: Adult Health

## 2015-07-21 ENCOUNTER — Non-Acute Institutional Stay: Payer: Medicare HMO | Admitting: Adult Health

## 2015-07-21 DIAGNOSIS — I480 Paroxysmal atrial fibrillation: Secondary | ICD-10-CM | POA: Diagnosis not present

## 2015-07-21 DIAGNOSIS — Z7901 Long term (current) use of anticoagulants: Secondary | ICD-10-CM

## 2015-07-21 NOTE — Progress Notes (Signed)
Patient ID: Shannon Mcclain, female   DOB: 03-23-1951, 64 y.o.   MRN: [id number] Subjective:     Indication: atrial fibrillation Bleeding signs/symptoms: None Thromboembolic signs/symptoms: None  Missed Coumadin doses: None Medication changes: no Dietary changes: no Bacterial/viral infection: no Other concerns: no  The following portions of the patient's history were reviewed and updated as appropriate: allergies, current medications, past family history, past medical history, past social history, past surgical history and problem list.  Review of Systems A comprehensive review of systems was negative.   Objective:    INR Today: 1.8 Current dose: 4 mg  Assessment:    Supratherapeutic INR for goal of 2-3   Plan:    1. New dose: increase Coumadin to 4.5 mg PO daily   2. Next INR:  07/25/15

## 2015-07-24 ENCOUNTER — Inpatient Hospital Stay (HOSPITAL_COMMUNITY)
Admission: EM | Admit: 2015-07-24 | Discharge: 2015-07-28 | DRG: 291 | Disposition: A | Discharge status: Skilled Nursing Facility | Payer: Medicare HMO | Attending: Internal Medicine | Admitting: Internal Medicine

## 2015-07-24 ENCOUNTER — Emergency Department (HOSPITAL_COMMUNITY): Payer: Medicare HMO

## 2015-07-24 ENCOUNTER — Encounter (HOSPITAL_COMMUNITY): Payer: Self-pay | Admitting: Emergency Medicine

## 2015-07-24 DIAGNOSIS — Z7982 Long term (current) use of aspirin: Secondary | ICD-10-CM

## 2015-07-24 DIAGNOSIS — Z9049 Acquired absence of other specified parts of digestive tract: Secondary | ICD-10-CM | POA: Diagnosis present

## 2015-07-24 DIAGNOSIS — I340 Nonrheumatic mitral (valve) insufficiency: Secondary | ICD-10-CM | POA: Diagnosis present

## 2015-07-24 DIAGNOSIS — Z952 Presence of prosthetic heart valve: Secondary | ICD-10-CM

## 2015-07-24 DIAGNOSIS — Z8249 Family history of ischemic heart disease and other diseases of the circulatory system: Secondary | ICD-10-CM

## 2015-07-24 DIAGNOSIS — Z880 Allergy status to penicillin: Secondary | ICD-10-CM

## 2015-07-24 DIAGNOSIS — Z885 Allergy status to narcotic agent status: Secondary | ICD-10-CM

## 2015-07-24 DIAGNOSIS — I5023 Acute on chronic systolic (congestive) heart failure: Secondary | ICD-10-CM | POA: Diagnosis not present

## 2015-07-24 DIAGNOSIS — M1990 Unspecified osteoarthritis, unspecified site: Secondary | ICD-10-CM | POA: Diagnosis present

## 2015-07-24 DIAGNOSIS — R269 Unspecified abnormalities of gait and mobility: Secondary | ICD-10-CM | POA: Diagnosis present

## 2015-07-24 DIAGNOSIS — I428 Other cardiomyopathies: Secondary | ICD-10-CM | POA: Diagnosis present

## 2015-07-24 DIAGNOSIS — Z7901 Long term (current) use of anticoagulants: Secondary | ICD-10-CM

## 2015-07-24 DIAGNOSIS — Z882 Allergy status to sulfonamides status: Secondary | ICD-10-CM

## 2015-07-24 DIAGNOSIS — Z90710 Acquired absence of both cervix and uterus: Secondary | ICD-10-CM

## 2015-07-24 DIAGNOSIS — J9620 Acute and chronic respiratory failure, unspecified whether with hypoxia or hypercapnia: Secondary | ICD-10-CM | POA: Diagnosis present

## 2015-07-24 DIAGNOSIS — R791 Abnormal coagulation profile: Secondary | ICD-10-CM

## 2015-07-24 DIAGNOSIS — J449 Chronic obstructive pulmonary disease, unspecified: Secondary | ICD-10-CM | POA: Diagnosis present

## 2015-07-24 DIAGNOSIS — K219 Gastro-esophageal reflux disease without esophagitis: Secondary | ICD-10-CM | POA: Diagnosis present

## 2015-07-24 DIAGNOSIS — Z9660 Presence of unspecified orthopedic joint implant: Secondary | ICD-10-CM | POA: Diagnosis present

## 2015-07-24 DIAGNOSIS — Z9981 Dependence on supplemental oxygen: Secondary | ICD-10-CM

## 2015-07-24 DIAGNOSIS — Z79899 Other long term (current) drug therapy: Secondary | ICD-10-CM

## 2015-07-24 DIAGNOSIS — I10 Essential (primary) hypertension: Secondary | ICD-10-CM | POA: Diagnosis present

## 2015-07-24 DIAGNOSIS — R079 Chest pain, unspecified: Secondary | ICD-10-CM | POA: Diagnosis present

## 2015-07-24 DIAGNOSIS — I160 Hypertensive urgency: Secondary | ICD-10-CM | POA: Diagnosis present

## 2015-07-24 DIAGNOSIS — I509 Heart failure, unspecified: Secondary | ICD-10-CM

## 2015-07-24 DIAGNOSIS — I493 Ventricular premature depolarization: Secondary | ICD-10-CM | POA: Diagnosis present

## 2015-07-24 DIAGNOSIS — I480 Paroxysmal atrial fibrillation: Secondary | ICD-10-CM | POA: Diagnosis present

## 2015-07-24 DIAGNOSIS — R001 Bradycardia, unspecified: Secondary | ICD-10-CM | POA: Diagnosis not present

## 2015-07-24 DIAGNOSIS — D649 Anemia, unspecified: Secondary | ICD-10-CM | POA: Diagnosis present

## 2015-07-24 MED ORDER — ASPIRIN 81 MG PO CHEW
324.0000 mg | CHEWABLE_TABLET | Freq: Once | ORAL | Status: AC
  Administered 2015-07-25: 324 mg via ORAL
  Filled 2015-07-24: qty 4

## 2015-07-24 MED ORDER — NITROGLYCERIN 0.4 MG SL SUBL: 0.4000 mg | SUBLINGUAL_TABLET | SUBLINGUAL | Status: DC | PRN

## 2015-07-24 NOTE — ED Provider Notes (Signed)
CSN: [id number]     Arrival date & time 07/24/63  2228 History   This chart was scribed for Shannon Booze, MD by Arlan Organ, ED Scribe. This patient was seen in room A08C/A08C and the patient's care was started 11:15 PM.   Chief Complaint  Patient presents with  . Hypertension   The history is provided by the patient. No language interpreter was used.    HPI Comments: [name] brought in by EMS is a 64 y.o. female with a PMHx of COPD, HTN, and CHF who presents to the Emergency Department complaining of constant, ongoing L side neck pain that radiates into the L side of the chest onset 64:30 PM this evening. Pain is described as sharp and currently rated 64/10. She also reports shortness of breath. Discomfort is exacerbated with palpation. No alleviating factors at this time. No OTC medications or home remedies attempted prior to arrival. However, 64 mg of Oxycodone given en route to department with mild improvement. No recent fever, chills, diaphoresis, nausea, or vomiting. PSHx includes mitral valve replacement performed at [facility]. Pt with known allergies to Codeine, Penicillins, and Sulfa antibiotics.  Past Medical History  Diagnosis Date  . Abnormality of gait   . COPD (chronic obstructive pulmonary disease)   . Hypertension   . Arthritis 01/05/2013    left  . CHF (congestive heart failure) 04/22/2015    NYHA class II, chronic, systolic   Past Surgical History  Procedure Laterality Date  . Joint replacement  08/08/2012  . Cholecystectomy    . Abdominal hysterectomy    . Colon surgery    . Cardiac surgery     History reviewed. No pertinent family history. Social History  Substance Use Topics  . Smoking status: Never Smoker   . Smokeless tobacco: None  . Alcohol Use: No   OB History    No data available     Review of Systems  Constitutional: Negative for fever, chills and diaphoresis.  Respiratory: Positive for shortness of breath.   Cardiovascular: Positive  for chest pain.  Gastrointestinal: Negative for nausea, vomiting and abdominal pain.  Musculoskeletal: Positive for neck pain.  Neurological: Negative for headaches.  Psychiatric/Behavioral: Negative for confusion.  All other systems reviewed and are negative.     Allergies  Codeine; Penicillins; and Sulfa antibiotics  Home Medications   Prior to Admission medications   Medication Sig Start Date End Date Taking? Authorizing Provider  amiodarone (PACERONE) 400 MG tablet Take 200 mg by mouth 2 (two) times daily.     Historical Provider, MD  aspirin 81 MG tablet Take 81 mg by mouth daily.    Historical Provider, MD  budesonide-formoterol (SYMBICORT) 160-4.5 MCG/ACT inhaler Inhale 2 puffs into the lungs 2 (two) times daily.    Historical Provider, MD  carvedilol (COREG) 12.5 MG tablet Take 12.5 mg by mouth 2 (two) times daily with a meal.    Historical Provider, MD  famotidine (PEPCID) 20 MG tablet Take 20 mg by mouth 2 (two) times daily.    Historical Provider, MD  furosemide (LASIX) 20 MG tablet Take 20 mg by mouth daily.    Historical Provider, MD  losartan (COZAAR) 50 MG tablet Take 50 mg by mouth daily.     Historical Provider, MD  oxyCODONE (OXY IR/ROXICODONE) 5 MG immediate release tablet Take 5 mg by mouth every 4 (four) hours as needed for severe pain.    Historical Provider, MD  potassium chloride SA (K-DUR,KLOR-CON) 20 MEQ tablet Take  10 mEq by mouth daily.     Historical Provider, MD  warfarin (COUMADIN) 5 MG tablet Take 5 mg by mouth daily.     Historical Provider, MD   Triage Vitals: BP 204/92 mmHg  Pulse 68  Temp(Src) 98.1 F (36.7 C) (Oral)  Resp 27  Ht  (1.6 m)  Wt 180 lb (81.647 kg)  BMI 31.89 kg/m2  SpO2 99%   Physical Exam  Constitutional: She is oriented to person, place, and time. She appears well-developed and well-nourished.  Appears dyspneic  HENT:  Head: Normocephalic and atraumatic.  Eyes: EOM are normal. Pupils are equal, round, and reactive to  light.  Neck: Normal range of motion. Neck supple. No JVD present.  Cardiovascular: Normal rate, regular rhythm and normal heart sounds.   No murmur heard. Pulmonary/Chest: Effort normal and breath sounds normal. She has no wheezes. She has no rales. She exhibits no tenderness.  Sternotomy scar present without any signs of infection Moderate tenderness to L upper chest wall No crepitus  Abdominal: Soft. Bowel sounds are normal. She exhibits no distension and no mass. There is no tenderness.  Musculoskeletal: Normal range of motion. She exhibits edema.  2 plus pitting edema   Lymphadenopathy:    She has no cervical adenopathy.  Neurological: She is alert and oriented to person, place, and time. No cranial nerve deficit. She exhibits normal muscle tone. Coordination normal.  Skin: Skin is warm and dry. No rash noted.  Psychiatric: She has a normal mood and affect. Her behavior is normal. Judgment and thought content normal.  Nursing note and vitals reviewed.   ED Course  Procedures (including critical care time)  DIAGNOSTIC STUDIES: Oxygen Saturation is 99% on RA, Normal by my interpretation.    COORDINATION OF CARE: 11:23 PM- Will give ASA and Nitrostat. Will order CXR, BNP, C-cimer, CBC, BMP, PT-INR, troponin I. Discussed treatment plan with pt at bedside and pt agreed to plan.     Labs Review Labs Reviewed - No data to display  Imaging Review No results found. I personally reviewed and evaluated these images and lab results as part of my medical decision-making.   EKG Interpretation   Date/Time:  Thursday July 24 2015 22:33:30 EDT Ventricular Rate:  79 PR Interval:  162 QRS Duration: 102 QT Interval:  399 QTC Calculation: 457 R Axis:   106 Text Interpretation:  Sinus rhythm Ventricular bigeminy Right axis  deviation Consider left ventricular hypertrophy Repol abnrm suggests  ischemia, diffuse leads When compared with ECG of 07/17/2015, Ventricular  bigeminy  is now  Present T wave inversion less evident in Inferior leads  and Anterolateral leads Confirmed by [facility]  MD, Chey Cho ([id number]) on 07/24/2015  11:17:28 PM      MDM   Final diagnoses:  CHF exacerbation  Essential hypertension  Subtherapeutic international normalized ratio (INR)    Chest pain of uncertain cause. She needs to be screened for pulmonary embolism, and will give trial of nitroglycerin. However, based on exam, this does appear to be chest wall pain. Records were reviewed from care everywhere and she did have mitral valve replacement on July 18 at [facility] with uncomplicated postoperative course.  She refuses nitroglycerin but pain has resolved. CT shows no evidence of pulmonary embolism. BNP is significantly elevated and I feel that most of her problems are related to fluid overload. Case is discussed with Dr. Toniann Fail of [facility] who agrees to admit the patient.  I personally performed the services described in  this documentation, which was scribed in my presence. The recorded information has been reviewed and is accurate.     Shannon Booze, MD 07/25/15 [phone]

## 2015-07-24 NOTE — ED Notes (Signed)
Pt here from [facility] s/p mitral valve replacement 06/30/15. Facility called for HTN. Pt was seen here for same 1 week ago. Pt wears 2l [place] all the time. Pt reports left sided chest soreness and SOB with exertion. Pt had 5mg  oxycodone PTA. Pt has hx of controlled afib rate from 30-60.

## 2015-07-24 NOTE — ED Notes (Signed)
Patient transported to X-ray 

## 2015-07-25 ENCOUNTER — Emergency Department (HOSPITAL_COMMUNITY): Payer: Medicare HMO

## 2015-07-25 ENCOUNTER — Encounter (HOSPITAL_COMMUNITY): Payer: Self-pay

## 2015-07-25 DIAGNOSIS — I10 Essential (primary) hypertension: Secondary | ICD-10-CM | POA: Diagnosis present

## 2015-07-25 DIAGNOSIS — Z79899 Other long term (current) drug therapy: Secondary | ICD-10-CM | POA: Diagnosis not present

## 2015-07-25 DIAGNOSIS — R079 Chest pain, unspecified: Secondary | ICD-10-CM

## 2015-07-25 DIAGNOSIS — Z952 Presence of prosthetic heart valve: Secondary | ICD-10-CM | POA: Diagnosis not present

## 2015-07-25 DIAGNOSIS — K219 Gastro-esophageal reflux disease without esophagitis: Secondary | ICD-10-CM | POA: Diagnosis present

## 2015-07-25 DIAGNOSIS — I480 Paroxysmal atrial fibrillation: Secondary | ICD-10-CM | POA: Diagnosis present

## 2015-07-25 DIAGNOSIS — I5023 Acute on chronic systolic (congestive) heart failure: Secondary | ICD-10-CM | POA: Diagnosis present

## 2015-07-25 DIAGNOSIS — J438 Other emphysema: Secondary | ICD-10-CM | POA: Diagnosis not present

## 2015-07-25 DIAGNOSIS — Z882 Allergy status to sulfonamides status: Secondary | ICD-10-CM | POA: Diagnosis not present

## 2015-07-25 DIAGNOSIS — D649 Anemia, unspecified: Secondary | ICD-10-CM | POA: Diagnosis present

## 2015-07-25 DIAGNOSIS — Z885 Allergy status to narcotic agent status: Secondary | ICD-10-CM | POA: Diagnosis not present

## 2015-07-25 DIAGNOSIS — J9621 Acute and chronic respiratory failure with hypoxia: Secondary | ICD-10-CM | POA: Diagnosis not present

## 2015-07-25 DIAGNOSIS — I340 Nonrheumatic mitral (valve) insufficiency: Secondary | ICD-10-CM | POA: Diagnosis present

## 2015-07-25 DIAGNOSIS — I428 Other cardiomyopathies: Secondary | ICD-10-CM | POA: Diagnosis present

## 2015-07-25 DIAGNOSIS — Z954 Presence of other heart-valve replacement: Secondary | ICD-10-CM

## 2015-07-25 DIAGNOSIS — Z9049 Acquired absence of other specified parts of digestive tract: Secondary | ICD-10-CM | POA: Diagnosis present

## 2015-07-25 DIAGNOSIS — R269 Unspecified abnormalities of gait and mobility: Secondary | ICD-10-CM | POA: Diagnosis present

## 2015-07-25 DIAGNOSIS — I160 Hypertensive urgency: Secondary | ICD-10-CM | POA: Diagnosis present

## 2015-07-25 DIAGNOSIS — Z90710 Acquired absence of both cervix and uterus: Secondary | ICD-10-CM | POA: Diagnosis not present

## 2015-07-25 DIAGNOSIS — I509 Heart failure, unspecified: Secondary | ICD-10-CM

## 2015-07-25 DIAGNOSIS — R0789 Other chest pain: Secondary | ICD-10-CM | POA: Diagnosis not present

## 2015-07-25 DIAGNOSIS — Z9981 Dependence on supplemental oxygen: Secondary | ICD-10-CM | POA: Diagnosis not present

## 2015-07-25 DIAGNOSIS — Z9660 Presence of unspecified orthopedic joint implant: Secondary | ICD-10-CM | POA: Diagnosis present

## 2015-07-25 DIAGNOSIS — J449 Chronic obstructive pulmonary disease, unspecified: Secondary | ICD-10-CM | POA: Diagnosis present

## 2015-07-25 DIAGNOSIS — Z7982 Long term (current) use of aspirin: Secondary | ICD-10-CM | POA: Diagnosis not present

## 2015-07-25 DIAGNOSIS — Z8249 Family history of ischemic heart disease and other diseases of the circulatory system: Secondary | ICD-10-CM | POA: Diagnosis not present

## 2015-07-25 DIAGNOSIS — M1990 Unspecified osteoarthritis, unspecified site: Secondary | ICD-10-CM | POA: Diagnosis present

## 2015-07-25 DIAGNOSIS — Z7901 Long term (current) use of anticoagulants: Secondary | ICD-10-CM | POA: Diagnosis not present

## 2015-07-25 DIAGNOSIS — J9620 Acute and chronic respiratory failure, unspecified whether with hypoxia or hypercapnia: Secondary | ICD-10-CM | POA: Diagnosis present

## 2015-07-25 DIAGNOSIS — Z880 Allergy status to penicillin: Secondary | ICD-10-CM | POA: Diagnosis not present

## 2015-07-25 DIAGNOSIS — I493 Ventricular premature depolarization: Secondary | ICD-10-CM | POA: Diagnosis present

## 2015-07-25 DIAGNOSIS — R001 Bradycardia, unspecified: Secondary | ICD-10-CM | POA: Diagnosis not present

## 2015-07-25 LAB — CBC WITH DIFFERENTIAL/PLATELET
BASOS PCT: 0 % (ref 0–1)
Basophils Absolute: 0 10*3/uL (ref 0.0–0.1)
Basophils Absolute: 0 10*3/uL (ref 0.0–0.1)
Basophils Relative: 0 % (ref 0–1)
EOS PCT: 2 % (ref 0–5)
Eosinophils Absolute: 0.1 10*3/uL (ref 0.0–0.7)
Eosinophils Absolute: 0.1 10*3/uL (ref 0.0–0.7)
Eosinophils Relative: 2 % (ref 0–5)
HCT: 31.9 % — ABNORMAL LOW (ref 36.0–46.0)
HEMATOCRIT: 36.3 % (ref 36.0–46.0)
HEMOGLOBIN: 11.2 g/dL — AB (ref 12.0–15.0)
Hemoglobin: 10 g/dL — ABNORMAL LOW (ref 12.0–15.0)
LYMPHS ABS: 0.8 10*3/uL (ref 0.7–4.0)
LYMPHS PCT: 14 % (ref 12–46)
Lymphocytes Relative: 11 % — ABNORMAL LOW (ref 12–46)
Lymphs Abs: 0.9 10*3/uL (ref 0.7–4.0)
MCH: 25.2 pg — ABNORMAL LOW (ref 26.0–34.0)
MCH: 25.5 pg — AB (ref 26.0–34.0)
MCHC: 30.9 g/dL (ref 30.0–36.0)
MCHC: 31.3 g/dL (ref 30.0–36.0)
MCV: 81.4 fL (ref 78.0–100.0)
MCV: 81.8 fL (ref 78.0–100.0)
MONO ABS: 0.3 10*3/uL (ref 0.1–1.0)
MONOS PCT: 5 % (ref 3–12)
Monocytes Absolute: 0.4 10*3/uL (ref 0.1–1.0)
Monocytes Relative: 6 % (ref 3–12)
NEUTROS ABS: 5 10*3/uL (ref 1.7–7.7)
NEUTROS PCT: 81 % — AB (ref 43–77)
Neutro Abs: 5.5 10*3/uL (ref 1.7–7.7)
Neutrophils Relative %: 79 % — ABNORMAL HIGH (ref 43–77)
PLATELETS: 414 10*3/uL — AB (ref 150–400)
Platelets: 363 10*3/uL (ref 150–400)
RBC: 4.44 MIL/uL (ref 3.87–5.11)
RBC: 3.92 MIL/uL (ref 3.87–5.11)
RDW: 17.9 % — ABNORMAL HIGH (ref 11.5–15.5)
RDW: 18.3 % — AB (ref 11.5–15.5)
WBC: 6.2 10*3/uL (ref 4.0–10.5)
WBC: 6.8 10*3/uL (ref 4.0–10.5)

## 2015-07-25 LAB — TSH: TSH: 3.8 u[IU]/mL (ref 0.350–4.500)

## 2015-07-25 LAB — D-DIMER, QUANTITATIVE: D-Dimer, Quant: 1.63 ug/mL-FEU — ABNORMAL HIGH (ref 0.00–0.48)

## 2015-07-25 LAB — PROTIME-INR
INR: 1.68 — ABNORMAL HIGH (ref 0.00–1.49)
Prothrombin Time: 19.8 seconds — ABNORMAL HIGH (ref 11.6–15.2)

## 2015-07-25 LAB — COMPREHENSIVE METABOLIC PANEL
ALBUMIN: 3.6 g/dL (ref 3.5–5.0)
ALK PHOS: 62 U/L (ref 38–126)
ALT: 17 U/L (ref 14–54)
ANION GAP: 9 (ref 5–15)
AST: 20 U/L (ref 15–41)
BILIRUBIN TOTAL: 0.7 mg/dL (ref 0.3–1.2)
BUN: 11 mg/dL (ref 6–20)
CHLORIDE: 98 mmol/L — AB (ref 101–111)
CO2: 33 mmol/L — ABNORMAL HIGH (ref 22–32)
CREATININE: 0.72 mg/dL (ref 0.44–1.00)
Calcium: 9.8 mg/dL (ref 8.9–10.3)
GFR calc Af Amer: >60 mL/min (ref >60)
GFR calc non Af Amer: >60 mL/min (ref >60)
GLUCOSE: 101 mg/dL — AB (ref 65–99)
Potassium: 3.5 mmol/L (ref 3.5–5.1)
Sodium: 140 mmol/L (ref 135–145)
Total Protein: 7.4 g/dL (ref 6.5–8.1)

## 2015-07-25 LAB — BASIC METABOLIC PANEL
Anion gap: 8 (ref 5–15)
BUN: 16 mg/dL (ref 6–20)
CALCIUM: 9 mg/dL (ref 8.9–10.3)
CO2: 28 mmol/L (ref 22–32)
Chloride: 100 mmol/L — ABNORMAL LOW (ref 101–111)
Creatinine, Ser: 0.69 mg/dL (ref 0.44–1.00)
GFR calc non Af Amer: >60 mL/min (ref >60)
Glucose, Bld: 104 mg/dL — ABNORMAL HIGH (ref 65–99)
Potassium: 5.7 mmol/L — ABNORMAL HIGH (ref 3.5–5.1)
Sodium: 136 mmol/L (ref 135–145)

## 2015-07-25 LAB — BRAIN NATRIURETIC PEPTIDE: B Natriuretic Peptide: 400.8 pg/mL — ABNORMAL HIGH (ref 0.0–100.0)

## 2015-07-25 LAB — PROCALCITONIN: Procalcitonin: <0.1 ng/mL

## 2015-07-25 LAB — TROPONIN I: TROPONIN I: 0.03 ng/mL (ref <0.031)

## 2015-07-25 LAB — MAGNESIUM: Magnesium: 1.9 mg/dL (ref 1.7–2.4)

## 2015-07-25 MED ORDER — AMIODARONE HCL 200 MG PO TABS
200.0000 mg | ORAL_TABLET | Freq: Two times a day (BID) | ORAL | Status: DC
  Administered 2015-07-25 (×2): 200 mg via ORAL
  Filled 2015-07-25 (×5): qty 1

## 2015-07-25 MED ORDER — SODIUM CHLORIDE 0.9 % IJ SOLN
3.0000 mL | Freq: Two times a day (BID) | INTRAMUSCULAR | Status: DC
  Administered 2015-07-25 – 2015-07-28 (×7): 3 mL via INTRAVENOUS

## 2015-07-25 MED ORDER — FUROSEMIDE 10 MG/ML IJ SOLN
40.0000 mg | Freq: Two times a day (BID) | INTRAMUSCULAR | Status: DC
  Administered 2015-07-25: 40 mg via INTRAVENOUS
  Filled 2015-07-25 (×3): qty 4

## 2015-07-25 MED ORDER — CARVEDILOL 12.5 MG PO TABS
12.5000 mg | ORAL_TABLET | Freq: Two times a day (BID) | ORAL | Status: DC
Start: 2015-07-25 — End: 2015-07-25
  Filled 2015-07-25 (×3): qty 1

## 2015-07-25 MED ORDER — BUDESONIDE-FORMOTEROL FUMARATE 160-4.5 MCG/ACT IN AERO
2.0000 | INHALATION_SPRAY | Freq: Two times a day (BID) | RESPIRATORY_TRACT | Status: DC
  Administered 2015-07-25 – 2015-07-28 (×6): 2 via RESPIRATORY_TRACT
  Filled 2015-07-25: qty 6

## 2015-07-25 MED ORDER — OXYCODONE HCL 5 MG PO TABS
5.0000 mg | ORAL_TABLET | ORAL | Status: DC | PRN
  Administered 2015-07-25 – 2015-07-27 (×5): 5 mg via ORAL
  Filled 2015-07-25 (×6): qty 1

## 2015-07-25 MED ORDER — ACETAMINOPHEN 650 MG RE SUPP: 650.0000 mg | Freq: Four times a day (QID) | RECTAL | Status: DC | PRN

## 2015-07-25 MED ORDER — FAMOTIDINE 20 MG PO TABS
20.0000 mg | ORAL_TABLET | Freq: Two times a day (BID) | ORAL | Status: DC
  Administered 2015-07-25 – 2015-07-28 (×7): 20 mg via ORAL
  Filled 2015-07-25 (×9): qty 1

## 2015-07-25 MED ORDER — WARFARIN SODIUM 6 MG PO TABS
6.0000 mg | ORAL_TABLET | Freq: Once | ORAL | Status: AC
  Administered 2015-07-25: 6 mg via ORAL
  Filled 2015-07-25 (×2): qty 1

## 2015-07-25 MED ORDER — LOSARTAN POTASSIUM 50 MG PO TABS
50.0000 mg | ORAL_TABLET | Freq: Every day | ORAL | Status: DC
  Administered 2015-07-25: 50 mg via ORAL
  Filled 2015-07-25 (×3): qty 1

## 2015-07-25 MED ORDER — ONDANSETRON HCL 4 MG/2ML IJ SOLN
4.0000 mg | Freq: Four times a day (QID) | INTRAMUSCULAR | Status: DC | PRN
  Administered 2015-07-25: 4 mg via INTRAVENOUS
  Filled 2015-07-25: qty 2

## 2015-07-25 MED ORDER — HYDRALAZINE HCL 20 MG/ML IJ SOLN
10.0000 mg | INTRAMUSCULAR | Status: DC | PRN
  Administered 2015-07-25: 10 mg via INTRAVENOUS
  Filled 2015-07-25: qty 1

## 2015-07-25 MED ORDER — ASPIRIN EC 81 MG PO TBEC
81.0000 mg | DELAYED_RELEASE_TABLET | Freq: Every day | ORAL | Status: DC
  Administered 2015-07-25 – 2015-07-28 (×4): 81 mg via ORAL
  Filled 2015-07-25 (×5): qty 1

## 2015-07-25 MED ORDER — IOHEXOL 350 MG/ML SOLN
80.0000 mL | Freq: Once | INTRAVENOUS | Status: AC | PRN
  Administered 2015-07-25: 80 mL via INTRAVENOUS

## 2015-07-25 MED ORDER — CARVEDILOL 12.5 MG PO TABS
12.5000 mg | ORAL_TABLET | Freq: Two times a day (BID) | ORAL | Status: DC
  Administered 2015-07-25 – 2015-07-28 (×8): 12.5 mg via ORAL
  Filled 2015-07-25 (×10): qty 1

## 2015-07-25 MED ORDER — FUROSEMIDE 10 MG/ML IJ SOLN
40.0000 mg | Freq: Once | INTRAMUSCULAR | Status: AC
  Administered 2015-07-25: 40 mg via INTRAVENOUS
  Filled 2015-07-25: qty 4

## 2015-07-25 MED ORDER — ONDANSETRON HCL 4 MG PO TABS: 4.0000 mg | ORAL_TABLET | Freq: Four times a day (QID) | ORAL | Status: DC | PRN

## 2015-07-25 MED ORDER — WARFARIN - PHARMACIST DOSING INPATIENT
Freq: Every day | Status: DC
  Administered 2015-07-25: 1

## 2015-07-25 MED ORDER — FUROSEMIDE 10 MG/ML IJ SOLN
40.0000 mg | Freq: Two times a day (BID) | INTRAMUSCULAR | Status: DC
Start: 2015-07-25 — End: 2015-07-25

## 2015-07-25 MED ORDER — ACETAMINOPHEN 325 MG PO TABS
650.0000 mg | ORAL_TABLET | Freq: Four times a day (QID) | ORAL | Status: DC | PRN
  Administered 2015-07-28: 650 mg via ORAL
  Filled 2015-07-25: qty 2

## 2015-07-25 NOTE — Progress Notes (Signed)
Patient Name: Shannon Mcclain Date of Encounter: 07/25/2015   Principal Problem:   Systolic CHF, acute on chronic Active Problems:   Mitral regurgitation S/P Mitral Valve Replacement   COPD (chronic obstructive pulmonary disease)   PAF (paroxysmal atrial fibrillation)   Hypertensive urgency   CHF (congestive heart failure)   Chest pain   Primary Cardiologist: Gwen Her, MD [facility])  Pt. Profile  52F with HT, NICM (EF 40-45%), MR s/p bioprosthetic MVR (06/30/15, [facility]) c/b POAF tx'd with DCCV and amiodarone/warfarin with intermittent bradycardia requiring temporary wire but no permanent pacemaker, COPD on 2L home oxygen, who presents with poorly controlled HTN, chest pain, and mild HF.   SUBJECTIVE  Did not slept well. Left upper chest pain improved.   CURRENT MEDS . amiodarone  200 mg Oral BID  . aspirin EC  81 mg Oral Daily  . budesonide-formoterol  2 puff Inhalation BID  . carvedilol  12.5 mg Oral BID WC  . famotidine  20 mg Oral BID  . furosemide  40 mg Intravenous BID  . losartan  50 mg Oral Daily  . sodium chloride  3 mL Intravenous Q12H  . warfarin  6 mg Oral ONCE-1800  . Warfarin - Pharmacist Dosing Inpatient   Does not apply q1800    OBJECTIVE  Filed Vitals:   07/25/15 0533 07/25/15 0558 07/25/15 0812 07/25/15 0841  BP: 185/102 198/97 184/91   Pulse: 68 89 73   Temp:  97.3 F (36.3 C)    TempSrc:  Oral    Resp: 19 20    Height:  5\' 3"  (1.6 m)    Weight:  170 lb 6.4 oz (77.293 kg)    SpO2: 100% 98%  98%    Intake/Output Summary (Last 24 hours) at 07/25/15 1114 Last data filed at 07/25/15 0757  Gross per 24 hour  Intake      0 ml  Output   2950 ml  Net  -2950 ml   Filed Weights   07/24/15 2239 07/25/15 0558  Weight: 180 lb (81.647 kg) 170 lb 6.4 oz (77.293 kg)    PHYSICAL EXAM  General: Pleasant, NAD. Neuro: Alert and oriented X 3. Moves all extremities spontaneously. Psych: Normal affect. HEENT:  Normal  Neck: Supple without  bruits. + JVD. Lungs:  Resp regular and unlabored. Faint basilar rales L> R. Mid sternal surgical line without erythema or edema. TTP Left upper chest.  Heart: irregular no s3, s4. Systolic murmurs. Abdomen: Soft, non-tender, non-distended, BS + x 4.  Extremities: No clubbing, cyanosis. Trace edema. DP/PT/Radials 2+ and equal bilaterally.  Accessory Clinical Findings  CBC  Recent Labs  07/25/15 0008 07/25/15 0750  WBC 6.8 6.2  NEUTROABS 5.5 5.0  HGB 10.0* 11.2*  HCT 31.9* 36.3  MCV 81.4 81.8  PLT 414* 363   Basic Metabolic Panel  Recent Labs  07/25/15 0008 07/25/15 0750  NA 136 140  K 5.7* 3.5  CL 100* 98*  CO2 28 33*  GLUCOSE 104* 101*  BUN 16 11  CREATININE 0.69 0.72  CALCIUM 9.0 9.8  MG  --  1.9   Liver Function Tests  Recent Labs  07/25/15 0750  AST 20  ALT 17  ALKPHOS 62  BILITOT 0.7  PROT 7.4  ALBUMIN 3.6   No results for input(s): LIPASE, AMYLASE in the last 72 hours. Cardiac Enzymes  Recent Labs  07/25/15 0008  TROPONINI 0.03   BNP Invalid input(s): POCBNP D-Dimer  Recent Labs  07/25/15 0008  DDIMER  1.63*   Thyroid Function Tests  Recent Labs  07/25/15 0750  TSH 3.800    TELE  Rate controlled afib.   Radiology/Studies  Ct Angio Head W/cm &/or Wo Cm  07/17/2015   CLINICAL DATA:  64 year old female who awoke with left frontal headache and left side blurred vision. Cardiac surgery in July. Atrial fibrillation. Initial encounter.  EXAM: CT ANGIOGRAPHY HEAD  TECHNIQUE: Multidetector CT imaging of the head was performed using the standard protocol during bolus administration of intravenous contrast. Multiplanar CT image reconstructions and MIPs were obtained to evaluate the vascular anatomy.  CONTRAST:  11mL OMNIPAQUE IOHEXOL 350 MG/ML SOLN  COMPARISON:  [facility] head CT without contrast report 04/10/1998 (no images available).  FINDINGS: CT HEAD  Brain: Cerebral volume is within normal limits for age. No  ventriculomegaly. No midline shift, mass effect, or evidence of intracranial mass lesion. Patchy and confluent bilateral cerebral white matter hypodensity. No evidence of cortically based acute infarction identified. No acute intracranial hemorrhage identified. Deep gray matter and posterior fossa gray-white matter differentiation is within normal limits.  Calvarium and skull base:  No acute osseous abnormality identified.  Paranasal sinuses: Visualized paranasal sinuses and mastoids are clear.  Orbits: Visualized orbits and scalp soft tissues are within normal limits.  CTA HEAD  Posterior circulation: Fairly codominant distal vertebral arteries. Both PICA origins appear patent. Patent vertebrobasilar junction. No basilar artery stenosis. SCA and right PCA origins are within normal limits. Fetal type left PCA origin. Right posterior communicating artery is present. Bilateral PCA branches are within normal limits.  Anterior circulation: Both ICA siphons are patent. No siphon atherosclerosis or stenosis identified. Ophthalmic and posterior communicating artery origins are within normal limits. Carotid termini are patent. MCA and ACA origins are within normal limits.  Dominant right ACA A1 segment. Anterior communicating artery and bilateral ACA branches are within normal limits. Left MCA M1 segment, bifurcation, and left MCA branches are within normal limits. Right MCA M1 segment, bifurcation, and right MCA branches are within normal limits.  Venous sinuses: Patent.  Anatomic variants: Fetal type left PCA origin.  Delayed phase:No abnormal enhancement identified.  IMPRESSION: 1. Negative intracranial CTA. 2. Moderate nonspecific cerebral white matter changes, most commonly due to chronic small vessel disease. No acute intracranial abnormality.   Electronically Signed   By: Odessa Fleming M.D.   On: 07/17/2015 11:30   Dg Chest 2 View  07/25/2015   CLINICAL DATA:  Acute onset of left-sided chest pain, radiating to the left  arm. Initial encounter.  EXAM: CHEST  2 VIEW  COMPARISON:  Chest radiograph performed 12/08/2004  FINDINGS: There is elevation of the right hemidiaphragm. A small right pleural effusion is noted. Mild bibasilar airspace opacities could reflect minimal interstitial edema. Underlying vascular congestion is noted. No pneumothorax is seen.  The heart is borderline enlarged. The patient is status post median sternotomy. A valve replacement is noted. No acute osseous abnormalities are identified.  IMPRESSION: Elevation of the right hemidiaphragm, with small right pleural effusion. Mild bibasilar airspace opacities could reflect minimal interstitial edema. Underlying vascular congestion and borderline cardiomegaly.   Electronically Signed   By: Roanna Raider M.D.   On: 07/25/2015 00:32   Ct Angio Chest Pe W/cm &/or Wo Cm  07/25/2015   CLINICAL DATA:  Status post recent mitral valve replacement, with acute onset of generalized chest pain and shortness of breath. Initial encounter.  EXAM: CT ANGIOGRAPHY CHEST WITH CONTRAST  TECHNIQUE: Multidetector CT imaging of the chest  was performed using the standard protocol during bolus administration of intravenous contrast. Multiplanar CT image reconstructions and MIPs were obtained to evaluate the vascular anatomy.  CONTRAST:  80mL OMNIPAQUE IOHEXOL 350 MG/ML SOLN  COMPARISON:  Chest radiograph performed 07/24/2015  FINDINGS: There is no evidence of central pulmonary embolus.  A trace right-sided pleural effusion is noted. Bibasilar airspace opacification, right greater than left, may reflect atelectasis or pneumonia. There is no evidence of pneumothorax. No masses are identified; no abnormal focal contrast enhancement is seen.  A mitral valve replacement is noted. Mild calcification along the inferior aspect of the left atrium may reflect small remote infarct. The patient is status post median sternotomy. No mediastinal lymphadenopathy is seen. No pericardial effusion is  identified. The great vessels are grossly unremarkable. No axillary lymphadenopathy is seen. The thyroid gland is unremarkable in appearance.  The visualized portions of the liver and spleen are unremarkable. Left renal atrophy is noted.  No acute osseous abnormalities are seen. Mild degenerative change is noted at the lower cervical spine.  Review of the MIP images confirms the above findings.  IMPRESSION: 1. No evidence of central pulmonary embolus. 2. Trace right-sided pleural effusion noted. Bibasilar airspace opacification, right greater than left, may reflect atelectasis or pneumonia. 3. Left renal atrophy noted.   Electronically Signed   By: Roanna Raider M.D.   On: 07/25/2015 03:01    ASSESSMENT AND PLAN   34F with HT, NICM (EF 40-45%), MR s/p bioprosthetic MVR (06/30/15, [facility]) c/b POAF tx'd with DCCV and amiodarone/warfarin with intermittent bradycardia requiring temporary wire but no permanent pacemaker, COPD on 2L home oxygen, who presents with poorly controlled HTN, chest pain, and mild HF.   1. Chest pain, history of normal coronaries. Suspect related to BP and CHF.  - Trop x 1 negative. CP improved. Her pain is at left upper chest, reproducible with palpation. Suspects MSK. Consider adding NSAID.  - D-dimer of 1.63, however CT of Chest without any evidence of PE. She is also on warfarin for anticoagulation.   2. Hypertensive Emergency, suspect BP will improve with diuresis. May require increased antihypertensives at discharge. - Continue coreg 12.5mg  BID, losartan 50mg , lasix 40mg  IV BID - PRN IV hydralazine to keep SBP at goal of 150-160. BP improved slightly.  - History of bradycardia may make uptitration of losartan preferable if K comes down with holding of home K supplements  3. Acute on chronic systolic CHF.  - She appears close to euvolemic -  Poorly controlled HTN may be a trigger for CHF or a result of worsening HF or both. Post op echo (at [facility]) and exam are c/w normal  valve function.  - Continue coreg 12.5mg  BID and Losartan 50mg  - Lasix 40mg  IV BID - Diuresed 3.2L so far, however weight reduce 10lb. Switch to PO lasix tonight or tomorrow.   4. Post operative AF. CHADSVASC = 3 (CHF, HTN, Female). Currently NSR with PVCs - continue amiodarone 200mg  BID; would consider decrease to 200mg  over the next week or so, particularly in light of history of bradycardia - continue carvedilol 12.5mg  BID - continue warfarin with goal INR 2-3 - K>4, Mg>2   Signed, Bhagat,Bhavinkumar PA-C Pager [phone]  Agree with above assessment and recommendations.  Presently she is in no distress. Exam reveals mild bibasilar rales. Chest incision is healing well. Heart sounds reveal soft systolic ejection murmur but no MR. No rub. Improving with diuresis. Telemetry shows NSR with occasional PVCs. Consider decreasing amiodarone down to 200  mg daily tomorrow.

## 2015-07-25 NOTE — Progress Notes (Addendum)
ANTICOAGULATION CONSULT NOTE - Initial Consult  Pharmacy Consult for Warfarin Indication: Mitral valve replacement/Afib   Allergies  Allergen Reactions  . Penicillins Shortness Of Breath  . Sulfa Antibiotics Itching  . Codeine Swelling    Patient Measurements: Height: 5\' 3"  (160 cm) Weight: 170 lb 6.4 oz (77.293 kg) IBW/kg (Calculated) : 52.4  Vital Signs: Temp: 97.3 F (36.3 C) (08/12 0558) Temp Source: Oral (08/12 0558) BP: 198/97 mmHg (08/12 0558) Pulse Rate: 89 (08/12 0558)  Labs:  Recent Labs  07/25/15 0008  HGB 10.0*  HCT 31.9*  PLT 414*  LABPROT 19.8*  INR 1.68*  CREATININE 0.69  TROPONINI 0.03    Estimated Creatinine Clearance: 70 mL/min (by C-G formula based on Cr of 0.69).   Medical History: Past Medical History  Diagnosis Date  . Abnormality of gait   . COPD (chronic obstructive pulmonary disease)   . Hypertension   . Arthritis 01/05/2013    left  . CHF (congestive heart failure) 04/22/2015    NYHA class II, chronic, systolic    Medications:  Prescriptions prior to admission  Medication Sig Dispense Refill Last Dose  . amiodarone (PACERONE) 400 MG tablet Take 200 mg by mouth 2 (two) times daily.    07/24/2015 at Unknown time  . aspirin EC 81 MG tablet Take 81 mg by mouth daily.   07/24/2015 at Unknown time  . budesonide-formoterol (SYMBICORT) 160-4.5 MCG/ACT inhaler Inhale 2 puffs into the lungs 2 (two) times daily.   07/24/2015 at Unknown time  . carvedilol (COREG) 12.5 MG tablet Take 12.5 mg by mouth 2 (two) times daily with a meal.   07/24/2015 at 1700  . famotidine (PEPCID) 20 MG tablet Take 20 mg by mouth 2 (two) times daily.   07/24/2015 at Unknown time  . furosemide (LASIX) 20 MG tablet Take 20 mg by mouth daily.   07/24/2015 at Unknown time  . losartan (COZAAR) 50 MG tablet Take 50 mg by mouth daily.    07/24/2015 at Unknown time  . oxyCODONE (OXY IR/ROXICODONE) 5 MG immediate release tablet Take 5 mg by mouth every 4 (four) hours as needed  for severe pain.   unknown  . potassium chloride SA (K-DUR,KLOR-CON) 20 MEQ tablet Take 10 mEq by mouth daily.    07/24/2015 at Unknown time  . warfarin (COUMADIN) 1 MG tablet Take 0.5 mg by mouth daily. Take with warfarin 4 mg   07/24/2015 at Unknown time  . warfarin (COUMADIN) 4 MG tablet Take 4 mg by mouth daily. Take with 0.5 tablet of warfarin 1 mg   07/24/2015 at Unknown time    Assessment: 52 YOF with h/o of nonischemic cardiomyopathy who has had recent mitral valve replacement. She was on Coumadin prior to admission for valve replacement and Afib. She is followed by Dr. Gwen Her at [facility]. Hg 11.2. Plt wnl. Pharmacy consulted to resume Coumadin therapy. She has a recent history of acute blood loss anemia. No bleeding noted.  INR SUBtherapeutic on admit 1.68.  Home Coumadin therapy: 4.5 mg daily   Goal of Therapy:  INR 2-3 (bioprosthetic MVR)  Monitor platelets by anticoagulation protocol: Yes   Plan:  -Give Coumadin 6 mg today  -Monitor daily PT/INR -Mon s/sx bleeding  Babs Bertin, PharmD Clinical Pharmacist Pager [phone] 07/25/2015 10:50 AM

## 2015-07-25 NOTE — ED Notes (Signed)
Transporting patient to new room assignment. 

## 2015-07-25 NOTE — H&P (Signed)
[facility] History and Physical  Shannon Mcclain ZOX:[id number] DOB: 1951-08-02 DOA: 07/24/2015  Referring physician: Dr.Glick. PCP: No primary care provider on file. patient's primary care physician is in [place]. Specialists: Patient's cardiac surgeon is in [facility].  Chief Complaint: Shortness of breath and chest pain.  HPI: Shannon Mcclain is a 64 y.o. female with history of nonischemic cardiomyopathy last EF measured was 40-45% who has had recent mitral valve replacement for mitral regurgitation last month July 18 was eventually discharged to rehabilitation [facility] when patient started developing chest pain and shortness of breath last night around 8 PM. Patient's symptoms were present even at rest and increases on exertion. Pain is left-sided chest radiating to her left side of the neck stabbing in nature persistent with cough nonproductive. Patient also notices increasing swelling of the lower extremities. In the ER patient had CT abdomen with the chest was negative for PE but did show pleural effusion and congestion. On-call cardiologist Dr. Zachery Conch was consulted and this time patient has been admitted for acute on chronic CHF with hypertensive emergency and chest pain. Patient has had cardiac cath in May of this year which was showing normal coronaries. Patient otherwise denies any nausea vomiting abdominal pain headache visual symptoms diarrhea. Patient chest pain has largely improved after patient was given Lasix 40 mg IV.  Review of Systems: As presented in the history of presenting illness, rest negative.  Past Medical History  Diagnosis Date  . Abnormality of gait   . COPD (chronic obstructive pulmonary disease)   . Hypertension   . Arthritis 01/05/2013    left  . CHF (congestive heart failure) 04/22/2015    NYHA class II, chronic, systolic   Past Surgical History  Procedure Laterality Date  . Joint replacement  08/08/2012  . Cholecystectomy    .  Abdominal hysterectomy    . Colon surgery    . Cardiac surgery    . Back surgery     Social History:  reports that she has never smoked. She does not have any smokeless tobacco history on file. She reports that she does not drink alcohol or use illicit drugs. Where does patient live home. Can patient participate in ADLs? Yes.  Allergies  Allergen Reactions  . Penicillins Shortness Of Breath  . Sulfa Antibiotics Itching  . Codeine Swelling    Family History:  Family History  Problem Relation Age of Onset  . Stroke Mother   . Hypertension Mother   . Stroke Father       Prior to Admission medications   Medication Sig Start Date End Date Taking? Authorizing Provider  amiodarone (PACERONE) 400 MG tablet Take 200 mg by mouth 2 (two) times daily.    Yes Historical Provider, MD  aspirin EC 81 MG tablet Take 81 mg by mouth daily.   Yes Historical Provider, MD  budesonide-formoterol (SYMBICORT) 160-4.5 MCG/ACT inhaler Inhale 2 puffs into the lungs 2 (two) times daily.   Yes Historical Provider, MD  carvedilol (COREG) 12.5 MG tablet Take 12.5 mg by mouth 2 (two) times daily with a meal.   Yes Historical Provider, MD  famotidine (PEPCID) 20 MG tablet Take 20 mg by mouth 2 (two) times daily.   Yes Historical Provider, MD  furosemide (LASIX) 20 MG tablet Take 20 mg by mouth daily.   Yes Historical Provider, MD  losartan (COZAAR) 50 MG tablet Take 50 mg by mouth daily.    Yes Historical Provider, MD  oxyCODONE (OXY IR/ROXICODONE) 5 MG  immediate release tablet Take 5 mg by mouth every 4 (four) hours as needed for severe pain.   Yes Historical Provider, MD  potassium chloride SA (K-DUR,KLOR-CON) 20 MEQ tablet Take 10 mEq by mouth daily.    Yes Historical Provider, MD  warfarin (COUMADIN) 1 MG tablet Take 0.5 mg by mouth daily. Take with warfarin 4 mg   Yes Historical Provider, MD  warfarin (COUMADIN) 4 MG tablet Take 4 mg by mouth daily. Take with 0.5 tablet of warfarin 1 mg   Yes Historical  Provider, MD    Physical Exam: Filed Vitals:   07/25/15 0143 07/25/15 0315 07/25/15 0455 07/25/15 0533  BP: 192/100  175/108 185/102  Pulse: 71 60 67 68  Temp:      TempSrc:      Resp: 23 15 26 19   Height:      Weight:      SpO2:  98% 100% 100%     General:  Moderately built and nourished.  Eyes: Anicteric no pallor.  ENT: No discharge from the ears eyes nose and mouth.  Neck: JVD elevated.  Cardiovascular: S1-S2 heard.  Respiratory: No rhonchi or crepitations.  Abdomen: Soft nontender bowel sounds present.  Skin: No rash.  Musculoskeletal: Bilateral lower extremity edema.  Psychiatric: Appears normal.  Neurologic: Alert awake oriented to time place and person. Moves all extremities.  Labs on Admission:  Basic Metabolic Panel:  Recent Labs Lab 07/25/15 0008  NA 136  K 5.7*  CL 100*  CO2 28  GLUCOSE 104*  BUN 16  CREATININE 0.69  CALCIUM 9.0   Liver Function Tests: No results for input(s): AST, ALT, ALKPHOS, BILITOT, PROT, ALBUMIN in the last 168 hours. No results for input(s): LIPASE, AMYLASE in the last 168 hours. No results for input(s): AMMONIA in the last 168 hours. CBC:  Recent Labs Lab 07/25/15 0008  WBC 6.8  NEUTROABS 5.5  HGB 10.0*  HCT 31.9*  MCV 81.4  PLT 414*   Cardiac Enzymes:  Recent Labs Lab 07/25/15 0008  TROPONINI 0.03    BNP (last 3 results)  Recent Labs  07/25/15 0008  BNP 400.8*    ProBNP (last 3 results) No results for input(s): PROBNP in the last 8760 hours.  CBG: No results for input(s): GLUCAP in the last 168 hours.  Radiological Exams on Admission: Dg Chest 2 View  07/25/2015   CLINICAL DATA:  Acute onset of left-sided chest pain, radiating to the left arm. Initial encounter.  EXAM: CHEST  2 VIEW  COMPARISON:  Chest radiograph performed 12/08/2004  FINDINGS: There is elevation of the right hemidiaphragm. A small right pleural effusion is noted. Mild bibasilar airspace opacities could reflect minimal  interstitial edema. Underlying vascular congestion is noted. No pneumothorax is seen.  The heart is borderline enlarged. The patient is status post median sternotomy. A valve replacement is noted. No acute osseous abnormalities are identified.  IMPRESSION: Elevation of the right hemidiaphragm, with small right pleural effusion. Mild bibasilar airspace opacities could reflect minimal interstitial edema. Underlying vascular congestion and borderline cardiomegaly.   Electronically Signed   By: Roanna Raider M.D.   On: 07/25/2015 00:32   Ct Angio Chest Pe W/cm &/or Wo Cm  07/25/2015   CLINICAL DATA:  Status post recent mitral valve replacement, with acute onset of generalized chest pain and shortness of breath. Initial encounter.  EXAM: CT ANGIOGRAPHY CHEST WITH CONTRAST  TECHNIQUE: Multidetector CT imaging of the chest was performed using the standard protocol during bolus administration of  intravenous contrast. Multiplanar CT image reconstructions and MIPs were obtained to evaluate the vascular anatomy.  CONTRAST:  80mL OMNIPAQUE IOHEXOL 350 MG/ML SOLN  COMPARISON:  Chest radiograph performed 07/24/2015  FINDINGS: There is no evidence of central pulmonary embolus.  A trace right-sided pleural effusion is noted. Bibasilar airspace opacification, right greater than left, may reflect atelectasis or pneumonia. There is no evidence of pneumothorax. No masses are identified; no abnormal focal contrast enhancement is seen.  A mitral valve replacement is noted. Mild calcification along the inferior aspect of the left atrium may reflect small remote infarct. The patient is status post median sternotomy. No mediastinal lymphadenopathy is seen. No pericardial effusion is identified. The great vessels are grossly unremarkable. No axillary lymphadenopathy is seen. The thyroid gland is unremarkable in appearance.  The visualized portions of the liver and spleen are unremarkable. Left renal atrophy is noted.  No acute osseous  abnormalities are seen. Mild degenerative change is noted at the lower cervical spine.  Review of the MIP images confirms the above findings.  IMPRESSION: 1. No evidence of central pulmonary embolus. 2. Trace right-sided pleural effusion noted. Bibasilar airspace opacification, right greater than left, may reflect atelectasis or pneumonia. 3. Left renal atrophy noted.   Electronically Signed   By: Roanna Raider M.D.   On: 07/25/2015 03:01    EKG: Independently reviewed. Normal sinus rhythm with PVCs.  Assessment/Plan Principal Problem:   Systolic CHF, acute on chronic Active Problems:   Mitral regurgitation S/P Mitral Valve Replacement   COPD (chronic obstructive pulmonary disease)   PAF (paroxysmal atrial fibrillation)   Hypertensive urgency   CHF (congestive heart failure)   Chest pain   1. Acute on chronic systolic heart failure last EF measured was 40-45% - appreciate cardiology consult. Patient has been placed on Lasix 40 mg IV every 12. Closely follow intake and output metabolic panel daily weights. 2. Hypertensive emergency - probably causing patient's symptoms. At this time patient in addition to Lasix IV has been placed on when necessary IV hydralazine and as per cardiology notes if patient's potassium improves then Cozaar dose to be increased. 3. Chest pain - probably secondary to uncontrolled blood pressure. Patient has had unremarkable cardiac cath May of this year. Cycle cardiac markers. Control blood pressure. Patient is on Coumadin and baby aspirin. When necessary nitroglycerin. 4. COPD presently not wheezing continue inhalers. 5. Paroxysmal atrial fibrillation pressing sinus rhythm - patient was tried cardioversion last month but patient reverted back to A. fib at that time. Presently patient is in sinus rhythm on amiodarone and Coumadin. Patient is also on Coreg. Chads 2 vasc score is 3. Patient's amiodarone dose has to be decreased next week to daily dosing. 6. Recent  bioprosthetic valve replacement in [facility] on 06/30/2015. 7. Normocytic normochromic anemia - follow CBC.  I have reviewed patient's chart from [facility] through care everywhere. I reviewed cardiologist consult and recommendations. Personally reviewed patient's chest x-ray and EKG.  Patient's CT angiogram of the chest showing possible pneumonia. Patient does not have any productive cough or fever chills. I have ordered procalcitonin levels which if high may consider antibiotics.   DVT Prophylaxis Coumadin.  Code Status: Full code.  Family Communication: Discussed with patient.  Disposition Plan: Admit to inpatient.    KAKRAKANDY,ARSHAD N. [facility] Pager [phone].  If 7PM-7AM, please contact night-coverage [email or web address] Password [facility] 07/25/2015, 5:57 AM

## 2015-07-25 NOTE — Progress Notes (Signed)
Utilization review completed. Alvetta Hidrogo, RN, BSN. 

## 2015-07-25 NOTE — ED Notes (Signed)
Patient Returned from X-ray 

## 2015-07-25 NOTE — Progress Notes (Signed)
Patient seen and examined. Admitted after midnight secondary to CP and signs of fluid overload. Patient afebrile and feeling better after initiated on IV lasix. Cardiology on board and will follow rec's. Troponin neg. Please referred to H&P written by Dr. Toniann Fail for further info/details on admission.  Plan: -cycle cardiac enzymes -continue IV diuresis -adjust medications for rate controlled (patient having episodes of bradycardia) -adjust antihypertensive agents  Vassie Loll [phone]

## 2015-07-25 NOTE — Consult Note (Addendum)
CARDIOLOGY CONSULT NOTE   Patient ID: Shannon [facility] MRN: [id number], DOB/AGE: 12/25/1950   Admit date: 07/24/2015 Date of Consult: 07/25/2015   Primary Physician: No primary care provider on file. Primary Cardiologist: Shannon Her, MD [facility])   Pt. 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   Problem List  Past Medical History  Diagnosis Date  . Abnormality of gait   . COPD (chronic obstructive pulmonary disease)   . Hypertension   . Arthritis 01/05/2013    left  . CHF (congestive heart failure) 04/22/2015    NYHA class II, chronic, systolic    Past Surgical History  Procedure Laterality Date  . Joint replacement  08/08/2012  . Cholecystectomy    . Abdominal hysterectomy    . Colon surgery    . Cardiac surgery       Allergies  Allergies  Allergen Reactions  . Penicillins Shortness Of Breath  . Sulfa Antibiotics Itching  . 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   Shannon Mcclain was discharged from [facility] on 07/15/15 after [facility] MVR in 7/18. She has been living at a NH since [facility] discharge. She has been been having a difficult time after [facility]clain operation. She has had poor PO intake (estimates she eats 10% of what is on Mcclain plate). She has been working with PT but is only able to walk across the room (with assist) until she has to rest due to dyspnea.   She presents to the ER today from [facility] due to worsened HTN with SBP > 200. [facility] SBPs  are typically at the highest. She endorsed left neck and chest pain at the time of Mcclain elevated BP reading. + leg swelling for a few days. + PND and 2 pillow orthopnea. + dry cough. No fevers, chills, sweats. Sternotomy site is healing well; no redness, drainage, swelling, or pain. Has been tolerating Mcclain warfarin aside from a few nose bleeds while still hospitalized at [facility].   On arrival to the ER, she was hypertensive to 206/122, HR 68, 94% on 2L (home level). Labs were notable for K 5.7, Cr 0.69, BNP 400.8, TnI 0.03, INR 1.68. CXR demonstrated mild effusions, cardiomegaly, and mild CHF. CT angiogram was ordered to rule out PE and it demonstrated no PE, trace right sided pleural effusion, b/l airspace opacification R>L. She was given 40mg  IV lasix, 324mg  PO aspirin.  Of note, pre-operative cardiac cath demonstrated normal coronary arteries. She maintains a low salt diet. She denies recent medication changes aside from warfarin.   Inpatient Medications  . furosemide  40 mg Intravenous Once    Family History History reviewed. No pertinent family history.   Social History Social History   Social History  . Marital Status: Widowed    Spouse Name: N/A  . Number of Children: N/A  . Years of Education: N/A   Occupational History  . Not on file.   Social History Main Topics  . Smoking status: Never Smoker   .  Smokeless tobacco: Not on file  . Alcohol Use: No  . Drug Use: No  . Sexual Activity: No   Other Topics Concern  . Not on file   Social History Narrative     Review of Systems  General:  No chills, fever, night sweats or weight changes.  Cardiovascular: See HPI Dermatological: No rash, lesions/masses Respiratory: + dry cough, dyspnea Urologic: No hematuria, dysuria Abdominal:   occassional nausea w/o vomiting, diarrhea, bright red blood per rectum, melena, or hematemesis Neurologic:  No visual changes, wkns, changes in mental status. All other systems  reviewed and are otherwise negative except as noted above.  Physical Exam  Blood pressure 192/100, pulse 60, temperature 98.1 F (36.7 C), temperature source Oral, resp. rate 15, height 5\' 3"  (1.6 m), weight 81.647 kg (180 lb), SpO2 98 %.  General: Pleasant, NAD Psych: Normal affect. Neuro: Alert and oriented X 3. Moves all extremities spontaneously. HEENT: Normal  Neck: Supple without bruits. JVP 11-12cmH20 Lungs:  Resp regular and unlabored, scattered faint rales, most prominent on left side Heart: RRR no s3, s4.  Soft systolic murmur across precordium.Marland Kitchen Abdomen: Soft, non-tender, non-distended, BS + x 4.  Extremities: No clubbing, cyanosis. Trace LE edema. DP/PT/Radials 2+ and equal bilaterally.  Labs   Recent Labs  07/25/15 0008  TROPONINI 0.03   Lab Results  Component Value Date   WBC 6.8 07/25/2015   HGB 10.0* 07/25/2015   HCT 31.9* 07/25/2015   MCV 81.4 07/25/2015   PLT 414* 07/25/2015    Recent Labs Lab 07/25/15 0008  NA 136  K 5.7*  CL 100*  CO2 28  BUN 16  CREATININE 0.69  CALCIUM 9.0  GLUCOSE 104*   No results found for: CHOL, HDL, LDLCALC, TRIG Lab Results  Component Value Date   DDIMER 1.63* 07/25/2015    Radiology/Studies  Ct Angio Head W/cm &/or Wo Cm  07/17/2015   CLINICAL DATA:  64 year old female who awoke with left frontal headache and left side blurred vision. Cardiac surgery in July. Atrial fibrillation. Initial encounter.  EXAM: CT ANGIOGRAPHY HEAD  TECHNIQUE: Multidetector CT imaging of the head was performed using the standard protocol during bolus administration of intravenous contrast. Multiplanar CT image reconstructions and MIPs were obtained to evaluate the vascular anatomy.  CONTRAST:  50mL OMNIPAQUE IOHEXOL 350 MG/ML SOLN  COMPARISON:  [facility] head CT without contrast report 04/10/1998 (no images available).  FINDINGS: CT HEAD  Brain: Cerebral volume is within normal limits for age. No ventriculomegaly. No midline  shift, mass effect, or evidence of intracranial mass lesion. Patchy and confluent bilateral cerebral white matter hypodensity. No evidence of cortically based acute infarction identified. No acute intracranial hemorrhage identified. Deep gray matter and posterior fossa gray-white matter differentiation is within normal limits.  Calvarium and skull base:  No acute osseous abnormality identified.  Paranasal sinuses: Visualized paranasal sinuses and mastoids are clear.  Orbits: Visualized orbits and scalp soft tissues are within normal limits.  CTA HEAD  Posterior circulation: Fairly codominant distal vertebral arteries. Both PICA origins appear patent. Patent vertebrobasilar junction. No basilar artery stenosis. SCA and right PCA origins are within normal limits. Fetal type left PCA origin. Right posterior communicating artery is present. Bilateral PCA branches are within normal limits.  Anterior circulation: Both ICA siphons are patent. No siphon atherosclerosis or stenosis identified. Ophthalmic and posterior communicating artery origins are within normal limits. Carotid termini are patent. MCA and ACA origins are within normal limits.  Dominant right ACA  A1 segment. Anterior communicating artery and bilateral ACA branches are within normal limits. Left MCA M1 segment, bifurcation, and left MCA branches are within normal limits. Right MCA M1 segment, bifurcation, and right MCA branches are within normal limits.  Venous sinuses: Patent.  Anatomic variants: Fetal type left PCA origin.  Delayed phase:No abnormal enhancement identified.  IMPRESSION: 1. Negative intracranial CTA. 2. Moderate nonspecific cerebral white matter changes, most commonly due to chronic small vessel disease. No acute intracranial abnormality.   Electronically Signed   By: Odessa Fleming M.D.   On: 07/17/2015 11:30   Dg Chest 2 View  07/25/2015   CLINICAL DATA:  Acute onset of left-sided chest pain, radiating to the left arm. Initial encounter.   EXAM: CHEST  2 VIEW  COMPARISON:  Chest radiograph performed 12/08/2004  FINDINGS: There is elevation of the right hemidiaphragm. A small right pleural effusion is noted. Mild bibasilar airspace opacities could reflect minimal interstitial edema. Underlying vascular congestion is noted. No pneumothorax is seen.  The heart is borderline enlarged. The patient is status post median sternotomy. A valve replacement is noted. No acute osseous abnormalities are identified.  IMPRESSION: Elevation of the right hemidiaphragm, with small right pleural effusion. Mild bibasilar airspace opacities could reflect minimal interstitial edema. Underlying vascular congestion and borderline cardiomegaly.   Electronically Signed   By: Roanna Raider M.D.   On: 07/25/2015 00:32   Ct Angio Chest Pe W/cm &/or Wo Cm  07/25/2015   CLINICAL DATA:  Status post recent mitral valve replacement, with acute onset of generalized chest pain and shortness of breath. Initial encounter.  EXAM: CT ANGIOGRAPHY CHEST WITH CONTRAST  TECHNIQUE: Multidetector CT imaging of the chest was performed using the standard protocol during bolus administration of intravenous contrast. Multiplanar CT image reconstructions and MIPs were obtained to evaluate the vascular anatomy.  CONTRAST:  80mL OMNIPAQUE IOHEXOL 350 MG/ML SOLN  COMPARISON:  Chest radiograph performed 07/24/2015  FINDINGS: There is no evidence of central pulmonary embolus.  A trace right-sided pleural effusion is noted. Bibasilar airspace opacification, right greater than left, may reflect atelectasis or pneumonia. There is no evidence of pneumothorax. No masses are identified; no abnormal focal contrast enhancement is seen.  A mitral valve replacement is noted. Mild calcification along the inferior aspect of the left atrium may reflect small remote infarct. The patient is status post median sternotomy. No mediastinal lymphadenopathy is seen. No pericardial effusion is identified. The great vessels  are grossly unremarkable. No axillary lymphadenopathy is seen. The thyroid gland is unremarkable in appearance.  The visualized portions of the liver and spleen are unremarkable. Left renal atrophy is noted.  No acute osseous abnormalities are seen. Mild degenerative change is noted at the lower cervical spine.  Review of the MIP images confirms the above findings.  IMPRESSION: 1. No evidence of central pulmonary embolus. 2. Trace right-sided pleural effusion noted. Bibasilar airspace opacification, right greater than left, may reflect atelectasis or pneumonia. 3. Left renal atrophy noted.   Electronically Signed   By: Roanna Raider M.D.   On: 07/25/2015 03:01   07/11/15 TTE Interpretation Summary A complete two-dimensional transthoracic echocardiogram was performed. The aortic valve is trileaflet. The left atrium is mildly dilated. The left ventricle is normal in size. There is mild concentric left ventricular hypertrophy. The left ventricular ejection fraction is moderately reduced (40-45%). There is mild diffuse hypokinesis of the left ventricle. Unable to adequately determine diastolic dysfunction. There is mild (1+) tricuspid regurgitation. Right ventricular systolic pressure is  elevated at , consistent with moderate pulmonary hypertension.  Left Ventricle The left ventricle is normal in size. There is mild concentric left ventricular hypertrophy. The left ventricular ejection fraction is moderately reduced (40-45%). There is mild diffuse hypokinesis of the left ventricle. Unable to adequately determine diastolic dysfunction.   Right Ventricle The right ventricle is grossly normal in size and function.  Atria The left atrium is mildly dilated.  Mitral Valve Bioprosthetic, prosthesis in the mitral position is well seated and functions normally. Prosthetic mitral valve peak and/or mean gradients are normal. Mean gradient 5.5 mm Hg.   Tricuspid Valve The tricuspid valve is  grossly normal. There is mild (1+) tricuspid regurgitation. Right ventricular systolic pressure is elevated at , consistent with moderate pulmonary hypertension.  Aortic Valve The aortic valve is trileaflet. There is no aortic stenosis. There is trace aortic regurgitation present.  Pulmonic Valve The pulmonic valve is not well seen, but is grossly normal. There is trace pulmonic regurgitation.  Vessels The aortic root is normal in diameter.  Pericardium There is no pericardial effusion.  MMode/2D Measurements & Calculations IVSd: 1.3 cm LVIDd: 5.1 cm LVIDs: 4.0 cm LVPWd: 1.4 cm  _____________________________________________________________ LV mass(C)d: 286.0 grams Ao root diam: 3.3 cm  LV mass(C)dI: 155.0 grams/m2 Ao root area: 8.4 cm2 LA dimension: 4.4 cm  Doppler Measurements & Calculations MV E max vel: 199.0 cm/sec MV max PG: 19.3 mmHg MV mean PG: 5.5 mmHg _____________________________________________________________  MV P1/2t max vel: 216.4 cm/sec TR max vel: 293.0 cm/sec MV P1/2t: 88.0 msec TR max PG: 34.3 mmHg MVA(P1/2t): 2.5 cm2 RVSP(TR): 49.3 mmHg  MV dec slope: 720.6 cm/sec2 _____________________________________________________________ RAP systole: 15.0 mmHg   04/20/15 Cardiac cath Hemodynamics:   Aortic pressure= 160/105 mmHg  Left ventricular pressure 160/12 mmHg  Left ventricle end diastolic pressure= 12 mmHg  PA pressure= 55/25 mmHg  RV pressure= 56/6 mmHg  Pulmonary capillary wedge pressure= 15 mmHg  RA pressure= 10 mmHg  Cardiac output by Fick= 6.6 L per minute squared     Coronary anatomy:   1. Left Main coronary artery: Normal  2. Left anterior descending artery: Normal  3. Left circumflex artery: Normal  4. Right coronary artery: Large, dominant, normal  LV gram performed showed mildly dilated left ventricle with global hypokinesis. Left ventricular ejection fraction is 40-45%. No gradient noted across the aortic valve.  Moderate to severe MR.    CONCLUSIONS:    1. Normal coronaries.   2. Mild to moderate LV dysfunction.  3. Moderate pulmonary hypertension.  4. CT surgery consultation for mitral valve repair will be obtained as outpatient.  ECG  07/24/15 @ 22:33: lead reversal. NSR IVCD, bigeminey. HR 79. 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   Chest pain, history of normal coronaries. Suspect related to BP and CHF rather than plaque rupture.  - Will aggressively treat BP and CHF - Cycle troponins  Hypertensive Emergency, suspect BP will improve with diuresis. May require increased antihypertensives at discharge. History of bradycardia may make uptitration of losartan preferable if K comes down with holding of home K supplements - Continue coreg 12.5mg  BID - losartan 50mg   - lasix 40mg  IV  BID - PRN IV hydralazine to keep SBP at goal of 150-160  Acute on chronic systolic CHF. She appears mildly volume overloaded on exam. By history, she may have been volume overloaded since hospital discharge. Poorly controlled HTN may be a trigger for CHF or a result of worsening HF or both. Post op echo (at [place]) and exam are c/w normal valve function.  - Continue coreg 12.5mg  BID - increase 50mg   - Lasix 40mg  IV BID  Post operative AF. CHADSVASC = 3 (CHF, HTN, Female). Currently NSR with PVCs - continue amiodarone 200mg  BID; would consider decrease to 200mg  over the next week or so, particularly in light of history of bradycardia - continue carvedilol 12.5mg  BID - continue warfarin with goal INR 2-3 - K>4,  Mg>2  Signed, Glori Luis, MD 07/25/2015, 3:57 AM

## 2015-07-26 DIAGNOSIS — J438 Other emphysema: Secondary | ICD-10-CM

## 2015-07-26 DIAGNOSIS — J9621 Acute and chronic respiratory failure with hypoxia: Secondary | ICD-10-CM

## 2015-07-26 DIAGNOSIS — I340 Nonrheumatic mitral (valve) insufficiency: Secondary | ICD-10-CM

## 2015-07-26 DIAGNOSIS — I480 Paroxysmal atrial fibrillation: Secondary | ICD-10-CM

## 2015-07-26 LAB — BASIC METABOLIC PANEL
Anion gap: 10 (ref 5–15)
BUN: 18 mg/dL (ref 6–20)
CALCIUM: 9.5 mg/dL (ref 8.9–10.3)
CO2: 33 mmol/L — ABNORMAL HIGH (ref 22–32)
CREATININE: 1.14 mg/dL — AB (ref 0.44–1.00)
Chloride: 96 mmol/L — ABNORMAL LOW (ref 101–111)
GFR calc Af Amer: 58 mL/min — ABNORMAL LOW (ref >60)
GFR, EST NON AFRICAN AMERICAN: 50 mL/min — AB (ref >60)
Glucose, Bld: 116 mg/dL — ABNORMAL HIGH (ref 65–99)
Potassium: 3.6 mmol/L (ref 3.5–5.1)
SODIUM: 139 mmol/L (ref 135–145)

## 2015-07-26 LAB — PROTIME-INR
INR: 1.83 — ABNORMAL HIGH (ref 0.00–1.49)
PROTHROMBIN TIME: 21.1 s — AB (ref 11.6–15.2)

## 2015-07-26 MED ORDER — AMIODARONE HCL 200 MG PO TABS
200.0000 mg | ORAL_TABLET | Freq: Every day | ORAL | Status: DC
  Administered 2015-07-27 – 2015-07-28 (×2): 200 mg via ORAL
  Filled 2015-07-26 (×2): qty 1

## 2015-07-26 MED ORDER — FUROSEMIDE 40 MG PO TABS
40.0000 mg | ORAL_TABLET | Freq: Every day | ORAL | Status: DC
  Administered 2015-07-26 – 2015-07-28 (×3): 40 mg via ORAL
  Filled 2015-07-26 (×4): qty 1

## 2015-07-26 MED ORDER — WARFARIN SODIUM 5 MG PO TABS
5.0000 mg | ORAL_TABLET | Freq: Once | ORAL | Status: AC
  Administered 2015-07-26: 5 mg via ORAL
  Filled 2015-07-26: qty 1

## 2015-07-26 NOTE — Progress Notes (Signed)
Patient had 9 beats of PVCs but was not considered V-tach due to the complex of wave. Patient was resting soundly and was asymptomatic. Notified hospitalist on call this morning. Will continue to monitor patient to end of shift.

## 2015-07-26 NOTE — Progress Notes (Signed)
ANTICOAGULATION CONSULT NOTE - Follow Up Consult  Pharmacy Consult for coumadin Indication: atrial fibrillation  Allergies  Allergen Reactions  . Penicillins Shortness Of Breath  . Sulfa Antibiotics Itching  . Codeine Swelling    Patient Measurements: Height: 5\' 3"  (160 cm) Weight: 173 lb 3.2 oz (78.563 kg) IBW/kg (Calculated) : 52.4 Heparin Dosing Weight:   Vital Signs: Temp: 97.3 F (36.3 C) (08/13 1001) Temp Source: Oral (08/13 1001) BP: 128/67 mmHg (08/13 1001) Pulse Rate: 66 (08/13 1001)  Labs:  Recent Labs  07/25/15 0008 07/25/15 0750 07/26/15 0323  HGB 10.0* 11.2*  --   HCT 31.9* 36.3  --   PLT 414* 363  --   LABPROT 19.8*  --  21.1*  INR 1.68*  --  1.83*  CREATININE 0.69 0.72 1.14*  TROPONINI 0.03  --   --     Estimated Creatinine Clearance: 49.5 mL/min (by C-G formula based on Cr of 1.14).   Medications:  Scheduled:  . [START ON 07/27/2015] amiodarone  200 mg Oral Daily  . aspirin EC  81 mg Oral Daily  . budesonide-formoterol  2 puff Inhalation BID  . carvedilol  12.5 mg Oral BID WC  . famotidine  20 mg Oral BID  . furosemide  40 mg Oral Daily  . sodium chloride  3 mL Intravenous Q12H  . Warfarin - Pharmacist Dosing Inpatient   Does not apply q1800   Infusions:    Assessment: 64 yo female with afib is currently on subtherapeutic coumadin.  INR is up to 1.83 from 1.68 after one dose of coumadin 6 mg last night.  Goal of Therapy:  INR 2-3 Monitor platelets by anticoagulation protocol: Yes   Plan:  - coumadin 5 mg po x1 - INR in am  Jessilynn Taft, Tsz-Yin 07/26/2015,11:22 AM

## 2015-07-26 NOTE — Progress Notes (Signed)
[facility] PROGRESS NOTE Interim History:  40F with HT, NICM (EF 40-45%), MR s/p bioprosthetic MVR (06/30/15, [facility]) c/b POAF tx'd with DCCV and amiodarone/warfarin with intermittent bradycardia requiring temporary wire but no permanent pacemaker, COPD on 2L home oxygen, who presents with poorly controlled HTN, chest pain, and CHF exacerbation.[facility]easar Mons Weights   07/24/15 2239 07/25/15 0558 07/26/15 0514  Weight: 81.647 kg (180 lb) 77.293 kg (170 lb 6.4 oz) 78.563 kg (173 lb 3.2 oz)        Intake/Output Summary (Last 24 hours) at 07/26/15 0959 Last data filed at 07/26/15 0843  Gross per 24 hour  Intake   1313 ml  Output   1950 ml  Net   -637 ml     Assessment/Plan: 1-Systolic CHF, acute on chronic: due to uncontrolled BP most likely; also not adherence to low sodium diet. -significant improvement with IV lasix treatment -patient is roughly 10 pounds off since admission -Cr has gone up -will switch to PO lasix -monitor I's and O's -continue low sodium diet and daily weight -will repeat BMET and BNP in am  2-PAF (paroxysmal atrial fibrillation): rate controlled -CHADsVASC score: 3 -on coumadin -continue coreg -cardiology helping adjusting amiodarone dose  4-HTN: with uncontrolled BP on admission -will continue adjusted dose of losartan -continue coreg and also adjusted dose of lasix now -advise to follow low sodium diet  5-acute on chronic resp failure: in setting of CHF exacerbation. Patient chronically on 2L of O2 supplementation. Chronic resp failure due to COPD -will continue symbicort -will continue O2 supplementation -no wheezing on exam -breathing better with improvement on CHF  6-Chest pain: most likely MSK after valve replacement vs demand ischemia with hypertension and CHF exacerbation. -troponin neg -currently CP free -will monitor   7-Mitral regurgitation S/P Mitral Valve Replacement: healing and recovering ok -follow up with outpatient  cardiologist at [facility]: will continue famotidine    Code Status: Full Family Communication: no family at bedside  Disposition Plan: back to SNF in the next 24-48 hours most likely)   Consultants:  Cardiology   Procedures: ECHO: not done during this admission -recently done at [facility] (EF 40-45%)  Antibiotics:  None   HPI/Subjective: Feeling better. No CP and breathing easier. No fever.  Objective: Filed Vitals:   07/25/15 2007 07/25/15 2036 07/26/15 0514 07/26/15 0916  BP: 149/81  149/91   Pulse: 72  56   Temp: 98 F (36.7 C)  97.6 F (36.4 C)   TempSrc: Oral  Oral   Resp: 18  18   Height:      Weight:   78.563 kg (173 lb 3.2 oz)   SpO2: 98% 99% 98% 98%     Exam:  General: Alert, awake, oriented x3, in no acute distress. Endorses she is breathing much better and denies CP  HEENT: No bruits, no goiter.  Heart: Regular rate, no rubs or gallops, positive murmur. Chest incision healing properly and w/o signs of infection. No edema. Lungs: improved air movement, no wheezing and no frank crackles appreciated on exam   Abdomen: Soft, nontender, nondistended, positive bowel sounds.  Neuro: Grossly intact, nonfocal.   Data Reviewed: Basic Metabolic Panel:  Recent Labs Lab 07/25/15 0008 07/25/15 0750 07/26/15 0323  NA 136 140 139  K 5.7* 3.5 3.6  CL 100* 98* 96*  CO2 28 33* 33*  GLUCOSE 104* 101* 116*  BUN 16 11 18   CREATININE 0.69 0.72 1.14*  CALCIUM 9.0 9.8 9.5  MG  --  1.9  --    Liver Function Tests:  Recent Labs Lab 07/25/15 0750  AST 20  ALT 17  ALKPHOS 62  BILITOT 0.7  PROT 7.4  ALBUMIN 3.6   CBC:  Recent Labs Lab 07/25/15 0008 07/25/15 0750  WBC 6.8 6.2  NEUTROABS 5.5 5.0  HGB 10.0* 11.2*  HCT 31.9* 36.3  MCV 81.4 81.8  PLT 414* 363   Cardiac Enzymes:  Recent Labs Lab 07/25/15 0008  TROPONINI 0.03   BNP (last 3 results)  Recent Labs  07/25/15 0008  BNP 400.8*    Studies: Dg Chest 2 View  07/25/2015    CLINICAL DATA:  Acute onset of left-sided chest pain, radiating to the left arm. Initial encounter.  EXAM: CHEST  2 VIEW  COMPARISON:  Chest radiograph performed 12/08/2004  FINDINGS: There is elevation of the right hemidiaphragm. A small right pleural effusion is noted. Mild bibasilar airspace opacities could reflect minimal interstitial edema. Underlying vascular congestion is noted. No pneumothorax is seen.  The heart is borderline enlarged. The patient is status post median sternotomy. A valve replacement is noted. No acute osseous abnormalities are identified.  IMPRESSION: Elevation of the right hemidiaphragm, with small right pleural effusion. Mild bibasilar airspace opacities could reflect minimal interstitial edema. Underlying vascular congestion and borderline cardiomegaly.   Electronically Signed   By: Roanna Raider M.D.   On: 07/25/2015 00:32   Ct Angio Chest Pe W/cm &/or Wo Cm  07/25/2015   CLINICAL DATA:  Status post recent mitral valve replacement, with acute onset of generalized chest pain and shortness of breath. Initial encounter.  EXAM: CT ANGIOGRAPHY CHEST WITH CONTRAST  TECHNIQUE: Multidetector CT imaging of the chest was performed using the standard protocol during bolus administration of intravenous contrast. Multiplanar CT image reconstructions and MIPs were obtained to evaluate the vascular anatomy.  CONTRAST:  56mL OMNIPAQUE IOHEXOL 350 MG/ML SOLN  COMPARISON:  Chest radiograph performed 07/24/2015  FINDINGS: There is no evidence of central pulmonary embolus.  A trace right-sided pleural effusion is noted. Bibasilar airspace opacification, right greater than left, may reflect atelectasis or pneumonia. There is no evidence of pneumothorax. No masses are identified; no abnormal focal contrast enhancement is seen.  A mitral valve replacement is noted. Mild calcification along the inferior aspect of the left atrium may reflect small remote infarct. The patient is status post median  sternotomy. No mediastinal lymphadenopathy is seen. No pericardial effusion is identified. The great vessels are grossly unremarkable. No axillary lymphadenopathy is seen. The thyroid gland is unremarkable in appearance.  The visualized portions of the liver and spleen are unremarkable. Left renal atrophy is noted.  No acute osseous abnormalities are seen. Mild degenerative change is noted at the lower cervical spine.  Review of the MIP images confirms the above findings.  IMPRESSION: 1. No evidence of central pulmonary embolus. 2. Trace right-sided pleural effusion noted. Bibasilar airspace opacification, right greater than left, may reflect atelectasis or pneumonia. 3. Left renal atrophy noted.   Electronically Signed   By: Roanna Raider M.D.   On: 07/25/2015 03:01    Scheduled Meds: . amiodarone  200 mg Oral BID  . aspirin EC  81 mg Oral Daily  . budesonide-formoterol  2 puff Inhalation BID  . carvedilol  12.5 mg Oral BID WC  . famotidine  20 mg Oral BID  . furosemide  40 mg Oral Daily  . sodium chloride  3 mL Intravenous Q12H  . Warfarin - Pharmacist Dosing Inpatient   Does  not apply q1800   Continuous Infusions:   Time:30 minutes  Vassie Loll  [facility] Pager [phone]. If 7PM-7AM, please contact night-coverage at [email or web address], password [facility] 07/26/2015, 9:59 AM  LOS: 1 day

## 2015-07-26 NOTE — Progress Notes (Signed)
Patient Name: Shannon Mcclain Date of Encounter: 07/26/2015   Principal Problem:   Systolic CHF, acute on chronic Active Problems:   Mitral regurgitation S/P Mitral Valve Replacement   COPD (chronic obstructive pulmonary disease)   PAF (paroxysmal atrial fibrillation)   Hypertensive urgency   CHF (congestive heart failure)   Chest pain   Primary Cardiologist: Gwen Her, MD [facility])  Pt. 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   SUBJECTIVE  Did not slept well. Left upper chest pain improved.   CURRENT MEDS . [START ON 07/27/2015] amiodarone  200 mg Oral Daily  . aspirin EC  81 mg Oral Daily  . budesonide-formoterol  2 puff Inhalation BID  . carvedilol  12.5 mg Oral BID WC  . famotidine  20 mg Oral BID  . furosemide  40 mg Oral Daily  . sodium chloride  3 mL Intravenous Q12H  . warfarin  5 mg Oral ONCE-1800  . Warfarin - Pharmacist Dosing Inpatient   Does not apply q1800    OBJECTIVE  Filed Vitals:   07/25/15 2036 07/26/15 0514 07/26/15 0916 07/26/15 1001  BP:  149/91  128/67  Pulse:  56  66  Temp:  97.6 F (36.4 C)  97.3 F (36.3 C)  TempSrc:  Oral  Oral  Resp:  18  18  Height:      Weight:  173 lb 3.2 oz (78.563 kg)    SpO2: 99% 98% 98% 100%    Intake/Output Summary (Last 24 hours) at 07/26/15 1326 Last data filed at 07/26/15 0843  Gross per 24 hour  Intake   1138 ml  Output   1650 ml  Net   -512 ml   Filed Weights   07/24/15 2239 07/25/15 0558 07/26/15 0514  Weight: 180 lb (81.647 kg) 170 lb 6.4 oz (77.293 kg) 173 lb 3.2 oz (78.563 kg)    PHYSICAL EXAM  General: Pleasant, NAD. Neuro: Alert and oriented X 3. Moves all extremities spontaneously. Psych: Normal affect. HEENT:   Normal  Neck: Supple without bruits. + JVD. Lungs:  Resp regular and unlabored. Faint basilar rales L> R. Mid sternal surgical line without erythema or edema. TTP Left upper chest.  Heart: irregular no s3, s4. Systolic murmurs. Abdomen: Soft, non-tender, non-distended, BS + x 4.  Extremities: No clubbing, cyanosis. Trace edema. DP/PT/Radials 2+ and equal bilaterally.  Accessory Clinical Findings  CBC  Recent Labs  07/25/15 0008 07/25/15 0750  WBC 6.8 6.2  NEUTROABS 5.5 5.0  HGB 10.0* 11.2*  HCT 31.9* 36.3  MCV 81.4 81.8  PLT 414* 363   Basic Metabolic Panel  Recent Labs  07/25/15 0750 07/26/15 0323  NA 140 139  K 3.5 3.6  CL 98* 96*  CO2 33* 33*  GLUCOSE 101* 116*  BUN 11 18  CREATININE 0.72 1.14*  CALCIUM 9.8 9.5  MG 1.9  --    Liver Function Tests  Recent Labs  07/25/15 0750  AST 20  ALT 17  ALKPHOS 62  BILITOT 0.7  PROT 7.4  ALBUMIN 3.6   No results for input(s): LIPASE, AMYLASE in the last 72 hours. Cardiac Enzymes  Recent Labs  07/25/15 0008  TROPONINI 0.03   BNP Invalid input(s): POCBNP D-Dimer  Recent Labs  07/25/15 0008  DDIMER 1.63*   Thyroid Function Tests  Recent Labs  07/25/15 0750  TSH 3.800    TELE  Rate controlled afib.   Radiology/Studies  Ct Angio Head W/cm &/or Wo Cm  07/17/2015   CLINICAL DATA:  64 year old female who awoke with left frontal headache and left side blurred vision. Cardiac surgery in July. Atrial fibrillation. Initial encounter.  EXAM: CT ANGIOGRAPHY HEAD  TECHNIQUE: Multidetector CT imaging of the head was performed using the standard protocol during bolus administration of intravenous contrast. Multiplanar CT image reconstructions and MIPs were obtained to evaluate the vascular anatomy.  CONTRAST:  19mL OMNIPAQUE IOHEXOL 350 MG/ML SOLN  COMPARISON:  [facility] head CT without contrast report 04/10/1998 (no images available).  FINDINGS: CT HEAD  Brain: Cerebral volume is within normal  limits for age. No ventriculomegaly. No midline shift, mass effect, or evidence of intracranial mass lesion. Patchy and confluent bilateral cerebral white matter hypodensity. No evidence of cortically based acute infarction identified. No acute intracranial hemorrhage identified. Deep gray matter and posterior fossa gray-white matter differentiation is within normal limits.  Calvarium and skull base:  No acute osseous abnormality identified.  Paranasal sinuses: Visualized paranasal sinuses and mastoids are clear.  Orbits: Visualized orbits and scalp soft tissues are within normal limits.  CTA HEAD  Posterior circulation: Fairly codominant distal vertebral arteries. Both PICA origins appear patent. Patent vertebrobasilar junction. No basilar artery stenosis. SCA and right PCA origins are within normal limits. Fetal type left PCA origin. Right posterior communicating artery is present. Bilateral PCA branches are within normal limits.  Anterior circulation: Both ICA siphons are patent. No siphon atherosclerosis or stenosis identified. Ophthalmic and posterior communicating artery origins are within normal limits. Carotid termini are patent. MCA and ACA origins are within normal limits.  Dominant right ACA A1 segment. Anterior communicating artery and bilateral ACA branches are within normal limits. Left MCA M1 segment, bifurcation, and left MCA branches are within normal limits. Right MCA M1 segment, bifurcation, and right MCA branches are within normal limits.  Venous sinuses: Patent.  Anatomic variants: Fetal type left PCA origin.  Delayed phase:No abnormal enhancement identified.  IMPRESSION: 1. Negative intracranial CTA. 2. Moderate nonspecific cerebral white matter changes, most commonly due to chronic small vessel disease. No acute intracranial abnormality.   Electronically Signed   By: Odessa Fleming M.D.   On: 07/17/2015 11:30   Dg Chest 2 View  07/25/2015   CLINICAL DATA:  Acute onset of left-sided chest pain,  radiating to the left arm. Initial encounter.  EXAM: CHEST  2 VIEW  COMPARISON:  Chest radiograph performed 12/08/2004  FINDINGS: There is elevation of the right hemidiaphragm. A small right pleural effusion is noted. Mild bibasilar airspace opacities could reflect minimal interstitial edema. Underlying vascular congestion is noted. No pneumothorax is seen.  The heart is borderline enlarged. The patient is status post median sternotomy. A valve replacement is noted. No acute osseous abnormalities are identified.  IMPRESSION: Elevation of the right hemidiaphragm, with small right pleural effusion. Mild bibasilar airspace opacities could reflect minimal interstitial edema. Underlying vascular congestion and borderline cardiomegaly.   Electronically Signed   By: Roanna Raider M.D.   On: 07/25/2015 00:32   Ct Angio Chest Pe W/cm &/or Wo Cm  07/25/2015   CLINICAL DATA:  Status post recent mitral valve replacement, with acute onset of generalized chest pain and shortness of breath. Initial encounter.  EXAM: CT ANGIOGRAPHY CHEST WITH CONTRAST  TECHNIQUE: Multidetector CT imaging of the  chest was performed using the standard protocol during bolus administration of intravenous contrast. Multiplanar CT image reconstructions and MIPs were obtained to evaluate the vascular anatomy.  CONTRAST:  80mL OMNIPAQUE IOHEXOL 350 MG/ML SOLN  COMPARISON:  Chest radiograph performed 07/24/2015  FINDINGS: There is no evidence of central pulmonary embolus.  A trace right-sided pleural effusion is noted. Bibasilar airspace opacification, right greater than left, may reflect atelectasis or pneumonia. There is no evidence of pneumothorax. No masses are identified; no abnormal focal contrast enhancement is seen.  A mitral valve replacement is noted. Mild calcification along the inferior aspect of the left atrium may reflect small remote infarct. The patient is status post median sternotomy. No mediastinal lymphadenopathy is seen. No  pericardial effusion is identified. The great vessels are grossly unremarkable. No axillary lymphadenopathy is seen. The thyroid gland is unremarkable in appearance.  The visualized portions of the liver and spleen are unremarkable. Left renal atrophy is noted.  No acute osseous abnormalities are seen. Mild degenerative change is noted at the lower cervical spine.  Review of the MIP images confirms the above findings.  IMPRESSION: 1. No evidence of central pulmonary embolus. 2. Trace right-sided pleural effusion noted. Bibasilar airspace opacification, right greater than left, may reflect atelectasis or pneumonia. 3. Left renal atrophy noted.   Electronically Signed   By: Roanna Raider M.D.   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   1. Chest pain, history of normal coronaries. Suspect related to BP and CHF.  - Trop x 1 negative. CP improved. Her pain is at left upper chest, reproducible with palpation. Suspects MSK. Consider adding NSAID.  - D-dimer of 1.63, however CT of Chest without any evidence of PE. She is also on warfarin for anticoagulation.   2. Hypertensive Emergency, suspect BP will improve with diuresis. May require increased antihypertensives at discharge. - Continue coreg 12.5mg  BID, losartan 50mg , lasix 40mg  IV BID - PRN IV hydralazine to keep SBP at goal of 150-160. BP improved slightly.  - History of bradycardia may make uptitration of losartan preferable if K comes down with holding of home K supplements  3. Acute on chronic systolic CHF.  - She appears euvolemic -  Poorly controlled HTN may be a trigger for CHF or a result of worsening HF or both. Post op echo (at [place]) and exam  are c/w normal valve function.  - Continue coreg 12.5mg  BID and Losartan 50mg  - Lasix now PO 40mg  QD - Diuresed 3.9L so far, however weight reduce 10lb.   4. Post operative AF. CHADSVASC = 3 (CHF, HTN, Female). Currently NSR with PVCs - continue amiodarone but decrease to 200mg  QD;  particularly in light of history of bradycardia - continue carvedilol 12.5mg  BID - continue warfarin with goal INR 2-3 - K>4, Mg>2  Possible SNF Sunday or Monday  Signed, Donato Schultz MD

## 2015-07-27 DIAGNOSIS — R0789 Other chest pain: Secondary | ICD-10-CM

## 2015-07-27 LAB — BASIC METABOLIC PANEL
ANION GAP: 9 (ref 5–15)
BUN: 17 mg/dL (ref 6–20)
CALCIUM: 9.4 mg/dL (ref 8.9–10.3)
CHLORIDE: 97 mmol/L — AB (ref 101–111)
CO2: 34 mmol/L — AB (ref 22–32)
CREATININE: 0.93 mg/dL (ref 0.44–1.00)
GFR calc non Af Amer: >60 mL/min (ref >60)
Glucose, Bld: 90 mg/dL (ref 65–99)
Potassium: 3.9 mmol/L (ref 3.5–5.1)
Sodium: 140 mmol/L (ref 135–145)

## 2015-07-27 LAB — PROCALCITONIN

## 2015-07-27 LAB — PROTIME-INR
INR: 1.88 — AB (ref 0.00–1.49)
Prothrombin Time: 21.6 seconds — ABNORMAL HIGH (ref 11.6–15.2)

## 2015-07-27 LAB — BRAIN NATRIURETIC PEPTIDE: B Natriuretic Peptide: 287.1 pg/mL — ABNORMAL HIGH (ref 0.0–100.0)

## 2015-07-27 MED ORDER — WARFARIN SODIUM 6 MG PO TABS
6.0000 mg | ORAL_TABLET | Freq: Once | ORAL | Status: AC
  Administered 2015-07-27: 6 mg via ORAL
  Filled 2015-07-27: qty 1

## 2015-07-27 MED ORDER — FUROSEMIDE 20 MG PO TABS: 40.0000 mg | ORAL_TABLET | Freq: Every day | ORAL | Status: AC

## 2015-07-27 MED ORDER — HYDRALAZINE HCL 10 MG PO TABS
10.0000 mg | ORAL_TABLET | Freq: Three times a day (TID) | ORAL | Status: DC
Start: 2015-07-27 — End: 2015-08-14

## 2015-07-27 MED ORDER — LOSARTAN POTASSIUM 50 MG PO TABS
50.0000 mg | ORAL_TABLET | Freq: Every day | ORAL | Status: DC
  Administered 2015-07-27 – 2015-07-28 (×2): 50 mg via ORAL
  Filled 2015-07-27 (×3): qty 1

## 2015-07-27 MED ORDER — OXYCODONE HCL 5 MG PO TABS: 5.0000 mg | ORAL_TABLET | Freq: Four times a day (QID) | ORAL | Status: AC | PRN

## 2015-07-27 MED ORDER — AMIODARONE HCL 400 MG PO TABS: 200.0000 mg | ORAL_TABLET | Freq: Every day | ORAL | Status: AC

## 2015-07-27 MED ORDER — HYDRALAZINE HCL 10 MG PO TABS
10.0000 mg | ORAL_TABLET | Freq: Three times a day (TID) | ORAL | Status: DC
  Administered 2015-07-27 – 2015-07-28 (×5): 10 mg via ORAL
  Filled 2015-07-27 (×7): qty 1

## 2015-07-27 NOTE — Progress Notes (Signed)
ANTICOAGULATION CONSULT NOTE - Follow Up Consult  Pharmacy Consult for coumadin Indication: afib and MVR  Allergies  Allergen Reactions  . Penicillins Shortness Of Breath  . Sulfa Antibiotics Itching  . Codeine Swelling    Patient Measurements: Height: 5\' 3"  (160 cm) Weight: 171 lb 6.4 oz (77.747 kg) (scale a ) IBW/kg (Calculated) : 52.4 Heparin Dosing Weight:   Vital Signs: Temp: 98.2 F (36.8 C) (08/14 0613) Temp Source: Oral (08/14 1779) BP: 147/91 mmHg (08/14 0911) Pulse Rate: 91 (08/14 0911)  Labs:  Recent Labs  07/25/15 0008 07/25/15 0750 07/26/15 0323 07/27/15 0532  HGB 10.0* 11.2*  --   --   HCT 31.9* 36.3  --   --   PLT 414* 363  --   --   LABPROT 19.8*  --  21.1* 21.6*  INR 1.68*  --  1.83* 1.88*  CREATININE 0.69 0.72 1.14* 0.93  TROPONINI 0.03  --   --   --     Estimated Creatinine Clearance: 60.3 mL/min (by C-G formula based on Cr of 0.93).   Medications:  Scheduled:  . amiodarone  200 mg Oral Daily  . aspirin EC  81 mg Oral Daily  . budesonide-formoterol  2 puff Inhalation BID  . carvedilol  12.5 mg Oral BID WC  . famotidine  20 mg Oral BID  . furosemide  40 mg Oral Daily  . hydrALAZINE  10 mg Oral 3 times per day  . sodium chloride  3 mL Intravenous Q12H  . Warfarin - Pharmacist Dosing Inpatient   Does not apply q1800   Infusions:    Assessment: 64 yo female with afib and recent MVR is currently on subtherapeutic coumadin.  INR didn't move much after 5 mg dose last night.  Goal of Therapy:  INR 2-3 Monitor platelets by anticoagulation protocol: Yes   Plan:  - coumadin 6 mg po x1 - INR in am  Zorion Nims, Tsz-Yin 07/27/2015,11:09 AM

## 2015-07-27 NOTE — Evaluation (Signed)
Physical Therapy Evaluation Patient Details Name: Shannon Mcclain MRN: [id number] DOB: 1951/07/01 Today's Date: 07/27/2015   History of Present Illness  Patient is a 64 yo female admitted 07/24/15 with SOB and chest pain.  Patient with CHF exacerbation, pleural effusion, HTN.  PMH:  NICM, MVR on 06/30/15, COPD on 2 liters O2, HTN, PAF, arthritis  Clinical Impression  Patient presents with problems listed below.  Will benefit from acute PT to maximize functional mobility prior to discharge.  Recommend patient return to SNF at discharge to complete rehab program.    Follow Up Recommendations SNF;Supervision/Assistance - 24 hour    Equipment Recommendations  None recommended by PT    Recommendations for Other Services       Precautions / Restrictions Precautions Precautions: Fall;Sternal Precaution Comments: Reviewed sternal precautions with patient.  Patient can recall precautions, and has her pillow with her. Restrictions Weight Bearing Restrictions: No Other Position/Activity Restrictions: sternal precautions      Mobility  Bed Mobility Overal bed mobility: Needs Assistance Bed Mobility: Supine to Sit;Sit to Supine     Supine to sit: Min assist Sit to supine: Min assist   General bed mobility comments: Verbal cues for technique to maintain sternal precautions.  Patient uses pillow correctly.  Able to move LE's off of bed.  Assist to raise trunk to sitting position.  Patient sat EOB to complete extremity evaluation/exercises.  Assist to bring LE's onto bed to return to supine.  Transfers                    Ambulation/Gait                Stairs            Wheelchair Mobility    Modified Rankin (Stroke Patients Only)       Balance Overall balance assessment: Needs assistance Sitting-balance support: No upper extremity supported;Feet supported Sitting balance-Leahy Scale: Good Sitting balance - Comments: Able to move UE's/LE's with good balance.                                      Pertinent Vitals/Pain Pain Assessment: No/denies pain    Home Living Family/patient expects to be discharged to:: Skilled nursing facility                 Additional Comments: Patient at [facility] following AVR.  Plans to return to complete rehab program.    Prior Function Level of Independence: Needs assistance   Gait / Transfers Assistance Needed: Requires assist for ambulation with RW - decreased balance  ADL's / Homemaking Assistance Needed: Assist with ADL's        Hand Dominance   Dominant Hand: Right    Extremity/Trunk Assessment   Upper Extremity Assessment: Generalized weakness (Decreased shoulder ROM due to sternal precautions)           Lower Extremity Assessment: Generalized weakness (Lt weaker than Rt)         Communication   Communication: No difficulties  Cognition Arousal/Alertness: Awake/alert Behavior During Therapy: WFL for tasks assessed/performed Overall Cognitive Status: Within Functional Limits for tasks assessed                      General Comments      Exercises        Assessment/Plan    PT Assessment Patient needs continued PT services  PT Diagnosis Difficulty walking;Generalized weakness   PT Problem List Decreased strength;Decreased activity tolerance;Decreased balance;Decreased mobility;Decreased knowledge of use of DME;Decreased knowledge of precautions;Cardiopulmonary status limiting activity  PT Treatment Interventions DME instruction;Gait training;Functional mobility training;Therapeutic activities;Therapeutic exercise;Patient/family education   PT Goals (Current goals can be found in the Care Plan section) Acute Rehab PT Goals Patient Stated Goal: To get stronger PT Goal Formulation: With patient Time For Goal Achievement: 08/10/15 Potential to Achieve Goals: Good    Frequency Min 2X/week   Barriers to discharge        Co-evaluation                End of Session Equipment Utilized During Treatment: Oxygen Activity Tolerance: Patient tolerated treatment well Patient left: in bed;with call bell/phone within reach;with bed alarm set;with family/visitor present           Time: 0102-7253 PT Time Calculation (min) (ACUTE ONLY): 11 min   Charges:   PT Evaluation $Initial PT Evaluation Tier I: 1 Procedure     PT G CodesVena Austria 08-26-2015, 1:49 PM Durenda Hurt. Renaldo Fiddler, [facility] Acute Rehab Services Pager [phone]

## 2015-07-27 NOTE — Clinical Social Work Note (Signed)
Clinical Social Work Assessment  Patient Details  Name: Shannon Mcclain MRN: [id number] Date of Birth: Apr 23, 1951  Date of referral:  07/27/15               Reason for consult:  Other (Comment Required) (from a facility)                Permission sought to share information with:  Case Manager, Magazine features editor, Family Supports Permission granted to share information::  Yes, Verbal Permission Granted  Name::        Agency::  [facility]  Relationship::  Roman,[phone]  Contact Information:     Housing/Transportation Living arrangements for the past 2 months:  Skilled Building surveyor of Information:  Patient, Medical Team, Adult Children Patient Interpreter Needed:  None Criminal Activity/Legal Involvement Pertinent to Current Situation/Hospitalization:  No - Comment as needed Significant Relationships:  Adult Children, Other Family Members Lives with:  Facility Resident Do you feel safe going back to the place where you live?  Yes Need for family participation in patient care:  Yes (Comment) (per patient request and family)  Care giving concerns:  No concerns at this time. Patient from a SNF ([facility]) planning for return when medically stable and needing insurance authorization to return to facility.  No concerns at this time for placement.   Social Worker assessment / plan:  LCSW covering for the weekend and followed up with patient as she is from [place] skilled facility.  Patient to return once medically stable and no barriers at this time.   FL2 updated and placed on chart for signature.  All agreeable to plan to return. No family at the bedside at time of assessment.  Patient to update. Will have weekday SW to follow up with disposition.  Employment status:  Retired Database administrator PT Recommendations:  Not assessed at this time Information / Referral to community resources:  Skilled Nursing Facility  (returning back to facility)  Patient/Family's Response to care:  All agreeable  Patient/Family's Understanding of and Emotional Response to Diagnosis, Current Treatment, and Prognosis: Agreeable to plan and aware of treatment/prognosis.  Patient hopeful for return to SNF.  Emotional Assessment Appearance:  Appears stated age Attitude/Demeanor/Rapport:  Other (cooperative) Affect (typically observed):  Accepting Orientation:  Oriented to Self, Oriented to Place, Oriented to  Time Alcohol / Substance use:  Not Applicable Psych involvement (Current and /or in the community):  No (Comment)  Discharge Needs  Concerns to be addressed:  Denies Needs/Concerns at this time Readmission within the last 30 days:  No Current discharge risk:  None Barriers to Discharge:  No Barriers Identified, Continued Medical Work up   Raye Sorrow, LCSW 07/27/2015, 8:40 AM

## 2015-07-27 NOTE — Progress Notes (Signed)
Patient Name: Shannon Mcclain Date of Encounter: 07/27/2015   Principal Problem:   Systolic CHF, acute on chronic Active Problems:   Mitral regurgitation S/P Mitral Valve Replacement   COPD (chronic obstructive pulmonary disease)   PAF (paroxysmal atrial fibrillation)   Hypertensive urgency   CHF (congestive heart failure)   Chest pain   Primary Cardiologist: Gwen Her, MD [facility])  Pt. Profile  64F with HT, NICM (EF 40-45%), MR s/p bioprosthetic MVR (06/30/15, [facility]) c/b POAF tx'd with DCCV and amiodarone/warfarin with intermittent bradycardia requiring temporary wire but no permanent pacemaker, COPD on 2L home oxygen, who presents with poorly controlled HTN, chest pain, and mild HF.   SUBJECTIVE   Left upper chest pain improved. Okay with going home to skilled nursing  CURRENT MEDS . amiodarone  200 mg Oral Daily  . aspirin EC  81 mg Oral Daily  . budesonide-formoterol  2 puff Inhalation BID  . carvedilol  12.5 mg Oral BID WC  . famotidine  20 mg Oral BID  . furosemide  40 mg Oral Daily  . hydrALAZINE  10 mg Oral 3 times per day  . sodium chloride  3 mL Intravenous Q12H  . Warfarin - Pharmacist Dosing Inpatient   Does not apply q1800    OBJECTIVE  Filed Vitals:   07/26/15 2030 07/27/15 0613 07/27/15 0911 07/27/15 0913  BP: 142/85 164/88 147/91   Pulse: 62 66 91   Temp: 98.4 F (36.9 C) 98.2 F (36.8 C)    TempSrc: Oral Oral    Resp: 18 18    Height:      Weight:  171 lb 6.4 oz (77.747 kg)    SpO2: 100% 97%  95%    Intake/Output Summary (Last 24 hours) at 07/27/15 0948 Last data filed at 07/27/15 1610  Gross per 24 hour  Intake    690 ml  Output   2250 ml  Net  -1560 ml   Filed Weights   07/25/15 0558 07/26/15 0514 07/27/15 0613  Weight: 170 lb 6.4 oz (77.293 kg) 173 lb 3.2 oz (78.563 kg) 171 lb 6.4 oz (77.747 kg)    PHYSICAL EXAM  General: Pleasant, NAD. Neuro: Alert and oriented X 3. Moves all extremities spontaneously. Psych: Normal  affect. HEENT:  Normal  Neck: Supple without bruits. + JVD. Lungs:  Resp regular and unlabored. Faint basilar rales L> R. Mid sternal surgical line without erythema or edema. TTP Left upper chest.  Heart: irregular no s3, s4. Systolic murmurs. Abdomen: Soft, non-tender, non-distended, BS + x 4.  Extremities: No clubbing, cyanosis. Trace edema. DP/PT/Radials 2+ and equal bilaterally.  Accessory Clinical Findings  CBC  Recent Labs  07/25/15 0008 07/25/15 0750  WBC 6.8 6.2  NEUTROABS 5.5 5.0  HGB 10.0* 11.2*  HCT 31.9* 36.3  MCV 81.4 81.8  PLT 414* 363   Basic Metabolic Panel  Recent Labs  07/25/15 0750 07/26/15 0323 07/27/15 0532  NA 140 139 140  K 3.5 3.6 3.9  CL 98* 96* 97*  CO2 33* 33* 34*  GLUCOSE 101* 116* 90  BUN CREATININE 0.72 1.14* 0.93  CALCIUM 9.8 9.5 9.4  MG 1.9  --   --    Liver Function Tests  Recent Labs  07/25/15 0750  AST 20  ALT 17  ALKPHOS 62  BILITOT 0.7  PROT 7.4  ALBUMIN 3.6   No results for input(s): LIPASE, AMYLASE in the last 72 hours. Cardiac Enzymes  Recent Labs  07/25/15  0008  TROPONINI 0.03   BNP Invalid input(s): POCBNP D-Dimer  Recent Labs  07/25/15 0008  DDIMER 1.63*   Thyroid Function Tests  Recent Labs  07/25/15 0750  TSH 3.800    TELE  Sinus rhythm, PVCs.   Radiology/Studies  Ct Angio Head W/cm &/or Wo Cm  07/17/2015   CLINICAL DATA:  64 year old female who awoke with left frontal headache and left side blurred vision. Cardiac surgery in July. Atrial fibrillation. Initial encounter.  EXAM: CT ANGIOGRAPHY HEAD  TECHNIQUE: Multidetector CT imaging of the head was performed using the standard protocol during bolus administration of intravenous contrast. Multiplanar CT image reconstructions and MIPs were obtained to evaluate the vascular anatomy.  CONTRAST:  49m29DarAlv(780[facility]09.(740)Marland Kitchen Marland Kitchen87Tho858AberDoFayreneSummerli277m3Dar09.(774)Marland Kitchen Marland Kitchen83Tho424NDoFayrene(4105m110Dar09.(204)Marland Kitchen Marland Kitchen23Tho512DoFayr09.579Marland Kitchen-Marland Kitchen85Tho3309.6Marland Kitchen6Marland Kitchen14Tho848McKinleyvDoFayreneRou209.859Marland Kitchen-Marland Kitchen60Tho562PotDoFayrenePicke09.802Marland Kitchen-Marland Kitchen06Tho971HazlDoFayren84809.507Marland Kitchen-Marland Kitchen44Tho(867BrackettvDoFayreneTo849m4Da09.(916Marland Kitchen)Marland Kitchen16Tho249Silver D09.360Marland Kitchen-Marland Kitchen15Tho304EvergDoFayreneL(09.631Marland Kitchen-Marland Kitchen55Tho(623WebDoFayrene09.(814Marland Kitchen)Marland Kitchen39Tho220BoDoFayrene909.919Marland Kitchen-Marland Kitchen54Tho7Sudden VaDoFayre622m66DarAl09.(252Marland Kitchen)Marland Kitchen05Tho858DonnDoFayreneHa909.725Marland Kitchen Marland Kitchen53Tho512West LibDoFayrene(672m189Da09.442Marland Kitchen-Marland Kitchen04Tho709Rancho CD09.(970Marland Kitchen)Marland Kitchen50Tho337RandallsDoFayrenePet65DaMarland Kitchenr824DarAlv(340)A09.(947)Marland Kitchen Marland Kitchen78Tho854Grand HDoFayrene(3101m33Dar09.787Marland Kitchen-Marland Kitchen87Tho782Lady D09.351Marland Kitchen-Marland Kitchen72Tho8JupDoFayreneBro563m74309.413Marland Kitchen-Marland Kitchen06Tho858GoDoFayrenePear651m929DarAlv803Central [place]ab09.915Marland Kitchen-Marland Kitchen70Tho(610KoDoFayreneWest Way909.(956Marland Kitchen)Marland Kitchen44Tho215MabsDoFayreneBloo66m961DarAlv682[facility] 09.734Marland Kitchen-Marland Kitchen16Tho360San RDoFayreneHut378m7509.919Marland Kitchen-Marland Kitchen02Tho4CDo09.951Marland Kitchen-Marland Kitchen19Tho971[place] FoothDoFayreneBai346m96Dar09.303Marland Kitchen Marland Kitchen88Tho785DaDoFayreneB868m10Da09.(863Marland Kitchen)Marland Kitchen74Tho416AmDoFayreneMapl(280m438DarAlv(905[facility] 09.(514)Marland Kitchen Marland Kitchen63Tho309East DeDoFayreneHartwick S(9339m09.606Marland Kitchen-Marland Kitchen37Tho947Bayou DoFayreneLandove09.706Marland Kitchen-Marland Kitchen43Tho(318DaDoFayreneCloverlea09.(914Marland Kitchen)Marland Kitchen74Tho4East BDoFayrene(09.(806Marland Kitchen)Marland Kitchen37Tho5SpurDoFayrene941m998DarA09.825Marland Kitchen-Marland Kitchen74Tho(787EncanDoFayreneLittl74m32DarAl09.(567)Marland Kitchen Marland Kitchen63Tho(515AltDoFayre09.608Marland Kitchen-Marland Kitchen28Tho403LitchfDoFayreneLak09.(540Marland Kitchen)Marland Kitchen50Tho435BelspDoFayreneS9609.209Marland Kitchen-Marland Kitchen25Tho(661[place] SelDoFayreneM4809.(256Marland Kitchen)Marland Kitchen03Tho(581)Picture [phone] agentOLN  COMPARISON:  [facility] head CT without contrast report 04/10/1998 (no images  available).  FINDINGS: CT HEAD  Brain: Cerebral volume is within normal limits for age. No ventriculomegaly. No midline shift, mass effect, or evidence of intracranial mass lesion. Patchy and confluent bilateral cerebral white matter hypodensity. No evidence of cortically based acute infarction identified. No acute intracranial hemorrhage identified. Deep gray matter and posterior fossa gray-white matter differentiation is within normal limits.  Calvarium and skull base:  No acute osseous abnormality identified.  Paranasal sinuses: Visualized paranasal sinuses and mastoids are clear.  Orbits: Visualized orbits and scalp soft tissues are within normal limits.  CTA HEAD  Posterior circulation: Fairly codominant distal vertebral arteries. Both PICA origins appear patent. Patent vertebrobasilar junction. No basilar artery stenosis. SCA and right PCA origins are within normal limits. Fetal type left PCA origin. Right posterior communicating artery is present. Bilateral PCA branches are within normal limits.  Anterior circulation: Both ICA siphons are patent. No siphon atherosclerosis or stenosis identified. Ophthalmic and posterior communicating artery origins are within normal limits. Carotid termini are patent. MCA and ACA origins are within normal limits.  Dominant right ACA A1 segment. Anterior communicating artery and bilateral ACA branches are within normal limits. Left MCA M1 segment, bifurcation, and left MCA branches are within normal limits. Right MCA M1 segment, bifurcation, and right MCA branches are within normal limits.  Venous sinuses: Patent.  Anatomic variants: Fetal type left PCA origin.  Delayed phase:No abnormal enhancement identified.  IMPRESSION: 1. Negative intracranial CTA. 2. Moderate nonspecific cerebral white matter changes, most commonly due to chronic small vessel disease. No acute intracranial abnormality.   Electronically Signed   By: H  Hall M.D.   On: 07/17/2015 11:30   Dg Chest 2  View  07/25/2015   CLINICAL DATA:  Acute onset of left-sided chest pain, radiating to the left arm. Initial encounter.  EXAM: CHEST  2 VIEW  COMPARISON:  Chest radiograph performed 12/08/2004  FINDINGS: There is elevation of the right hemidiaphragm. A small right pleural effusion is noted. Mild bibasilar airspace opacities could reflect minimal interstitial edema. Underlying vascular congestion is noted. No pneumothorax is seen.  The heart is borderline enlarged. The patient is status post median sternotomy. A valve replacement is noted. No acute osseous abnormalities are identified.  IMPRESSION: Elevation of the right hemidiaphragm, with small right pleural effusion. Mild bibasilar airspace opacities could reflect minimal interstitial edema. Underlying vascular congestion and borderline cardiomegaly.   Electronically Signed   By: Jeffery  Chang M.D.   On: 07/25/2015 00:32   Ct Angio Chest Pe W/cm &/or Wo Cm  07/25/2015   CLINICAL DATA:  Status post recent mitral valve replacement, with acute onset of generalized chest pain and shortness  of breath. Initial encounter.  EXAM: CT ANGIOGRAPHY CHEST WITH CONTRAST  TECHNIQUE: Multidetector CT imaging of the chest was performed using the standard protocol during bolus administration of intravenous contrast. Multiplanar CT image reconstructions and MIPs were obtained to evaluate the vascular anatomy.  CONTRAST:  68mL OMNIPAQUE IOHEXOL 350 MG/ML SOLN  COMPARISON:  Chest radiograph performed 07/24/2015  FINDINGS: There is no evidence of central pulmonary embolus.  A trace right-sided pleural effusion is noted. Bibasilar airspace opacification, right greater than left, may reflect atelectasis or pneumonia. There is no evidence of pneumothorax. No masses are identified; no abnormal focal contrast enhancement is seen.  A mitral valve replacement is noted. Mild calcification along the inferior aspect of the left atrium may reflect small remote infarct. The patient is status  post median sternotomy. No mediastinal lymphadenopathy is seen. No pericardial effusion is identified. The great vessels are grossly unremarkable. No axillary lymphadenopathy is seen. The thyroid gland is unremarkable in appearance.  The visualized portions of the liver and spleen are unremarkable. Left renal atrophy is noted.  No acute osseous abnormalities are seen. Mild degenerative change is noted at the lower cervical spine.  Review of the MIP images confirms the above findings.  IMPRESSION: 1. No evidence of central pulmonary embolus. 2. Trace right-sided pleural effusion noted. Bibasilar airspace opacification, right greater than left, may reflect atelectasis or pneumonia. 3. Left renal atrophy noted.   Electronically Signed   By: Roanna Raider M.D.   On: 07/25/2015 03:01    ASSESSMENT AND PLAN   19F with HT, NICM (EF 40-45%), MR s/p bioprosthetic MVR (06/30/15, [facility]) c/b POAF tx'd with DCCV and amiodarone/warfarin with intermittent bradycardia requiring temporary wire but no permanent pacemaker, COPD on 2L home oxygen, who presents with poorly controlled HTN, chest pain, and mild HF.   1. Chest pain, history of normal coronaries. Suspect related to BP and CHF.  - Trop x 1 negative. CP improved. Her pain is at left upper chest, reproducible with palpation. Suspects MSK. Consider adding NSAID.  - D-dimer of 1.63, however CT of Chest without any evidence of PE. She is also on warfarin for anticoagulation.   2. Hypertensive Emergency, suspect BP will improve with diuresis. May require increased antihypertensives at discharge. - Continue coreg 12.5mg  BID, losartan 50mg , lasix 40mg  QD - OK with low dose hydralazine addition - History of bradycardia may make uptitration of losartan preferable if K comes down with holding of home K supplements  3. Acute on chronic systolic CHF.  - She appears euvolemic -  Poorly controlled HTN may be a trigger for CHF or a result of worsening HF or both. Post op  echo (at [place]) and exam are c/w normal valve function. Hopefully with fluid reduction this will improve. - Continue coreg 12.5mg  BID and Losartan 50mg  (restart, fell off list) - Lasix now PO 40mg  QD - Diuresed 5.4L.  4. Post operative AF. CHADSVASC = 3 (CHF, HTN, Female). Currently NSR with PVCs - continue amiodarone but decrease to 200mg  QD;  particularly in light of history of bradycardia - continue carvedilol 12.5mg  BID - continue warfarin with goal INR 2-3 - K>4, Mg>2  DC to SNF OK, check basic profile as outpatient Continue with cardiology follow-up Primary Cardiologist: Gwen Her, MD [facility])  Signed, Donato Schultz MD

## 2015-07-27 NOTE — Care Management Note (Signed)
Case Management Note  Patient Details  Name: Shannon Mcclain MRN: [id number] Date of Birth: 1951/05/03  Subjective/Objective:                  Mitral regurgitation S/P Mitral Valve Replacement  Action/Plan:  Discharge planning Expected Discharge Date:                 Expected Discharge Plan:  Skilled Nursing Facility  In-House Referral:     Discharge planning Services  CM Consult  Post Acute Care Choice:    Choice offered to:     DME Arranged:    DME Agency:     HH Arranged:    HH Agency:     Status of Service:  Completed, signed off  Medicare Important Message Given:    Date Medicare IM Given:    Medicare IM give by:    Date Additional Medicare IM Given:    Additional Medicare Important Message give by:     If discussed at Long Length of Stay Meetings, dates discussed:    Additional Comments: CM notes pt from [facility] and plan is to return to SNF post discharge; CSW arranging.  No other CM needs were communicated. Yves Dill, RN 07/27/2015, 3:05 PM

## 2015-07-27 NOTE — Progress Notes (Addendum)
Physician Discharge Summary  Shannon Mcclain NWG:[id number] DOB: [birth year] DOA: 07/24/2015  PCP: No primary care provider on file.  Admit date: 07/24/2015 Discharge date: 07/28/2015  Time spent: >30  minutes  Recommendations for Outpatient Follow-up:  Check weight on daily basis Follow low sodium diet (< 2 gram daily) Take medications as prescribed BMET in 5 days to follow electrolytes and renal function  Please MD at facility to reassess BP and adjust antihypertensive agents as needed  Discharge Diagnoses:  Principal Problem:   Systolic CHF, acute on chronic Active Problems:   Mitral regurgitation S/P Mitral Valve Replacement   COPD (chronic obstructive pulmonary disease)   PAF (paroxysmal atrial fibrillation)   Hypertensive urgency   CHF (congestive heart failure)   Chest pain   Discharge Condition: stable and improved. Will discharge back to SNF for further rehab and care. Follow up with Cardiologist in 10 days  Diet recommendation: heart healthy diet  Filed Weights   07/25/15 0558 07/26/15 0514 07/27/15 0613  Weight: 77.293 kg (170 lb 6.4 oz) 78.563 kg (173 lb 3.2 oz) 77.747 kg (171 lb 6.4 oz)    History of present illness:  14F with HT, NICM (EF 40-45%), MR s/p bioprosthetic MVR (06/30/15, [facility]) c/b POAF tx'd with DCCV and amiodarone/warfarin with intermittent bradycardia requiring temporary wire but no permanent pacemaker, COPD on 2L home oxygen, who presents with poorly controlled HTN, chest pain, and CHF exacerbation.Marland Kitchen    Hospital Course:  1-Systolic CHF, acute on chronic: due to uncontrolled BP most likely; also not adherence to low sodium diet. -significant improvement with IV lasix treatment -patient is roughly 12 pounds off since admission -renal function went up slightly during IV diuresis, but back to normal at discharge -lasix dose has been adjusted to 40mg  daily -continue low sodium diet and daily weight -BMET in 5 days to follow electrolytes and renal  function   2-PAF (paroxysmal atrial fibrillation): rate controlled -CHADsVASC score: 3 -on coumadin -continue coreg and amiodarone, per cardiology rec's last one change to 200 mg daily  4-HTN/hypertensive urgency: with uncontrolled BP on admission -will discharge on losartan 50mg  daily, Coreg 12.5 mg BID and hydralazine 10mg  TID -patient will also has lasix adjusted to 40mg  daily -advise to follow low sodium diet  5-acute on chronic resp failure: in setting of CHF exacerbation. Patient chronically on 2L of O2 supplementation. Chronic resp failure due to COPD. -will continue symbicort -will continue O2 supplementation -no wheezing on exam and with good air movement  -breathing better and back to baseline after improvement on CHF control  6-Chest pain: most likely MSK after valve replacement vs demand ischemia with uncontrolled hypertension and CHF exacerbation. -troponin neg X 3 -currently CP free -patient diuretics dose adjusted and started on hydralazine for better BP control   7-Mitral regurgitation S/P Mitral Valve Replacement: healing and recovering ok -follow up with outpatient cardiologist at [facility]: will continue famotidine   Procedures:  ECHO: not done during this admission -recently done at [facility] (EF 40-45%)  Consultations:  Cardiology   Discharge Exam: Filed Vitals:   07/27/15 0911  BP: 147/91  Pulse: 91  Temp:   Resp:    General: Alert, awake, oriented x3, in no acute distress. Endorses her breathing is back to normal. Denies CP  HEENT: No bruits, no goiter, no JVD Heart: Regular rate, no rubs or gallops, positive murmur. Chest incision healing properly and w/o signs of infection. No LE edema. Lungs: improved air movement, no wheezing and no frank  crackles appreciated on exam  Abdomen: Soft, nontender, nondistended, positive bowel sounds.  Neuro: Grossly intact, nonfocal.   Discharge Instructions   Discharge Instructions    Diet -  low sodium heart healthy    Complete by:  As directed      Discharge instructions    Complete by:  As directed   Check weight on daily basis Follow low sodium diet (< 2 gram daily) Take medications as prescribed BMET in 5 days to follow electrolytes and renal function  Please MD at facility to reassess BP and adjust antihypertensive agents as needed          Current Discharge Medication List    START taking these medications   Details  hydrALAZINE (APRESOLINE) 10 MG tablet Take 1 tablet (10 mg total) by mouth every 8 (eight) hours.      CONTINUE these medications which have CHANGED   Details  amiodarone (PACERONE) 400 MG tablet Take 0.5 tablets (200 mg total) by mouth daily.    furosemide (LASIX) 20 MG tablet Take 2 tablets (40 mg total) by mouth daily. Qty: 30 tablet    oxyCODONE (OXY IR/ROXICODONE) 5 MG immediate release tablet Take 1 tablet (5 mg total) by mouth every 6 (six) hours as needed for severe pain. Qty: 15 tablet, Refills: 0      CONTINUE these medications which have NOT CHANGED   Details  aspirin EC 81 MG tablet Take 81 mg by mouth daily.    budesonide-formoterol (SYMBICORT) 160-4.5 MCG/ACT inhaler Inhale 2 puffs into the lungs 2 (two) times daily.    carvedilol (COREG) 12.5 MG tablet Take 12.5 mg by mouth 2 (two) times daily with a meal.    famotidine (PEPCID) 20 MG tablet Take 20 mg by mouth 2 (two) times daily.    losartan (COZAAR) 50 MG tablet Take 50 mg by mouth daily.     potassium chloride SA (K-DUR,KLOR-CON) 20 MEQ tablet Take 10 mEq by mouth daily.     !! warfarin (COUMADIN) 1 MG tablet Take 0.5 mg by mouth daily. Take with warfarin 4 mg    !! warfarin (COUMADIN) 4 MG tablet Take 4 mg by mouth daily. Take with 0.5 tablet of warfarin 1 mg     !! - Potential duplicate medications found. Please discuss with provider.     Allergies  Allergen Reactions  . Penicillins Shortness Of Breath  . Sulfa Antibiotics Itching  . Codeine Swelling    The  results of significant diagnostics from this hospitalization (including imaging, microbiology, ancillary and laboratory) are listed below for reference.    Significant Diagnostic Studies: Ct Angio Head W/cm &/or Wo Cm  07/17/2015   CLINICAL DATA:  64 year old female who awoke with left frontal headache and left side blurred vision. Cardiac surgery in July. Atrial fibrillation. Initial encounter.  EXAM: CT ANGIOGRAPHY HEAD  TECHNIQUE: Multidetector CT imaging of the head was performed using the standard protocol during bolus administration of intravenous contrast. Multiplanar CT image reconstructions and MIPs were obtained to evaluate the vascular anatomy.  CONTRAST:  50mL OMNIPAQUE IOHEXOL 350 MG/ML SOLN  COMPARISON:  [facility] head CT without contrast report 04/10/1998 (no images available).  FINDINGS: CT HEAD  Brain: Cerebral volume is within normal limits for age. No ventriculomegaly. No midline shift, mass effect, or evidence of intracranial mass lesion. Patchy and confluent bilateral cerebral white matter hypodensity. No evidence of cortically based acute infarction identified. No acute intracranial hemorrhage identified. Deep gray matter and posterior fossa gray-white  matter differentiation is within normal limits.  Calvarium and skull base:  No acute osseous abnormality identified.  Paranasal sinuses: Visualized paranasal sinuses and mastoids are clear.  Orbits: Visualized orbits and scalp soft tissues are within normal limits.  CTA HEAD  Posterior circulation: Fairly codominant distal vertebral arteries. Both PICA origins appear patent. Patent vertebrobasilar junction. No basilar artery stenosis. SCA and right PCA origins are within normal limits. Fetal type left PCA origin. Right posterior communicating artery is present. Bilateral PCA branches are within normal limits.  Anterior circulation: Both ICA siphons are patent. No siphon atherosclerosis or stenosis identified. Ophthalmic and  posterior communicating artery origins are within normal limits. Carotid termini are patent. MCA and ACA origins are within normal limits.  Dominant right ACA A1 segment. Anterior communicating artery and bilateral ACA branches are within normal limits. Left MCA M1 segment, bifurcation, and left MCA branches are within normal limits. Right MCA M1 segment, bifurcation, and right MCA branches are within normal limits.  Venous sinuses: Patent.  Anatomic variants: Fetal type left PCA origin.  Delayed phase:No abnormal enhancement identified.  IMPRESSION: 1. Negative intracranial CTA. 2. Moderate nonspecific cerebral white matter changes, most commonly due to chronic small vessel disease. No acute intracranial abnormality.   Electronically Signed   By: Odessa Fleming M.D.   On: 07/17/2015 11:30   Dg Chest 2 View  07/25/2015   CLINICAL DATA:  Acute onset of left-sided chest pain, radiating to the left arm. Initial encounter.  EXAM: CHEST  2 VIEW  COMPARISON:  Chest radiograph performed 12/08/2004  FINDINGS: There is elevation of the right hemidiaphragm. A small right pleural effusion is noted. Mild bibasilar airspace opacities could reflect minimal interstitial edema. Underlying vascular congestion is noted. No pneumothorax is seen.  The heart is borderline enlarged. The patient is status post median sternotomy. A valve replacement is noted. No acute osseous abnormalities are identified.  IMPRESSION: Elevation of the right hemidiaphragm, with small right pleural effusion. Mild bibasilar airspace opacities could reflect minimal interstitial edema. Underlying vascular congestion and borderline cardiomegaly.   Electronically Signed   By: Roanna Raider M.D.   On: 07/25/2015 00:32   Ct Angio Chest Pe W/cm &/or Wo Cm  07/25/2015   CLINICAL DATA:  Status post recent mitral valve replacement, with acute onset of generalized chest pain and shortness of breath. Initial encounter.  EXAM: CT ANGIOGRAPHY CHEST WITH CONTRAST   TECHNIQUE: Multidetector CT imaging of the chest was performed using the standard protocol during bolus administration of intravenous contrast. Multiplanar CT image reconstructions and MIPs were obtained to evaluate the vascular anatomy.  CONTRAST:  80mL OMNIPAQUE IOHEXOL 350 MG/ML SOLN  COMPARISON:  Chest radiograph performed 07/24/2015  FINDINGS: There is no evidence of central pulmonary embolus.  A trace right-sided pleural effusion is noted. Bibasilar airspace opacification, right greater than left, may reflect atelectasis or pneumonia. There is no evidence of pneumothorax. No masses are identified; no abnormal focal contrast enhancement is seen.  A mitral valve replacement is noted. Mild calcification along the inferior aspect of the left atrium may reflect small remote infarct. The patient is status post median sternotomy. No mediastinal lymphadenopathy is seen. No pericardial effusion is identified. The great vessels are grossly unremarkable. No axillary lymphadenopathy is seen. The thyroid gland is unremarkable in appearance.  The visualized portions of the liver and spleen are unremarkable. Left renal atrophy is noted.  No acute osseous abnormalities are seen. Mild degenerative change is noted at the lower cervical spine.  Review  of the MIP images confirms the above findings.  IMPRESSION: 1. No evidence of central pulmonary embolus. 2. Trace right-sided pleural effusion noted. Bibasilar airspace opacification, right greater than left, may reflect atelectasis or pneumonia. 3. Left renal atrophy noted.   Electronically Signed   By: Roanna Raider M.D.   On: 07/25/2015 03:01   Labs: Basic Metabolic Panel:  Recent Labs Lab 07/25/15 0008 07/25/15 0750 07/26/15 0323 07/27/15 0532  NA 136 140 139 140  K 5.7* 3.5 3.6 3.9  CL 100* 98* 96* 97*  CO2 28 33* 33* 34*  GLUCOSE 104* 101* 116* 90  BUN 16 11 18 17   CREATININE 0.69 0.72 1.14* 0.93  CALCIUM 9.0 9.8 9.5 9.4  MG  --  1.9  --   --    Liver  Function Tests:  Recent Labs Lab 07/25/15 0750  AST 20  ALT 17  ALKPHOS 62  BILITOT 0.7  PROT 7.4  ALBUMIN 3.6   CBC:  Recent Labs Lab 07/25/15 0008 07/25/15 0750  WBC 6.8 6.2  NEUTROABS 5.5 5.0  HGB 10.0* 11.2*  HCT 31.9* 36.3  MCV 81.4 81.8  PLT 414* 363   Cardiac Enzymes:  Recent Labs Lab 07/25/15 0008  TROPONINI 0.03   BNP: BNP (last 3 results)  Recent Labs  07/25/15 0008 07/27/15 0532  BNP 400.8* 287.1*    Signed:  Vassie Loll  [facility] 07/27/2015, 9:39 AM

## 2015-07-28 LAB — PROTIME-INR
INR: 1.67 — AB (ref 0.00–1.49)
Prothrombin Time: 19.7 seconds — ABNORMAL HIGH (ref 11.6–15.2)

## 2015-07-28 MED ORDER — WARFARIN SODIUM 7.5 MG PO TABS
7.5000 mg | ORAL_TABLET | Freq: Once | ORAL | Status: AC
  Administered 2015-07-28: 7.5 mg via ORAL
  Filled 2015-07-28 (×2): qty 1

## 2015-07-28 NOTE — Progress Notes (Signed)
Transferred to [facility] facility via [facility]

## 2015-07-28 NOTE — Progress Notes (Signed)
OK per MD for d/c today back to [facility]. CSW worked with Admissions at [facility] throughout the day in attempt to secure prior authorization from [facility] for SNF.  Authorization was just received per call from Admissions at the SNF; ok per patient and daughter Doran Clay for d/c back to SNF via EMS.  Patient assisted with obtaining authorization by contacting her insurance company as the facility could not accept back without authorization.  CSW notified RN to call report.  No further CSW needs identified. CSW signing off.  Lorri Frederick. Jaci Lazier, [place] [phone]

## 2015-07-28 NOTE — Progress Notes (Signed)
ANTICOAGULATION CONSULT NOTE - Follow Up Consult  Pharmacy Consult for coumadin Indication: Afib and MVR  Allergies  Allergen Reactions  . Penicillins Shortness Of Breath  . Sulfa Antibiotics Itching  . Codeine Swelling    Patient Measurements: Height: 5\' 3"  (160 cm) Weight: 171 lb 6.4 oz (77.747 kg) IBW/kg (Calculated) : 52.4  Vital Signs: Temp: 98.3 F (36.8 C) (08/15 0640) Temp Source: Oral (08/15 0640) BP: 138/88 mmHg (08/15 1115) Pulse Rate: 62 (08/15 1115)  Labs:  Recent Labs  07/26/15 0323 07/27/15 0532 07/28/15 0325  LABPROT 21.1* 21.6* 19.7*  INR 1.83* 1.88* 1.67*  CREATININE 1.14* 0.93  --     Estimated Creatinine Clearance: 60.3 mL/min (by C-G formula based on Cr of 0.93).  Assessment: 64 yo female with afib and recent MVR (bioprostheti) is currently subtherapeutic on coumadin. INR went down from 1.88 to 1.67. Last Hgb was 11.2, plts wnl, no s/s of bleed.  PTA Coumadin 4.5mg  daily  Goal of Therapy:  INR 2-3 Monitor platelets by anticoagulation protocol: Yes   Plan:  Give coumadin 7.5mg  PO x 1 tonight Monitor daily INR, CBC, s/s of bleed   Ivyana Locey J 07/28/2015,1:17 PM

## 2015-07-28 NOTE — Progress Notes (Signed)
attemtps to  Call [facility] unsuccessful . Marland Kitchen Left report charge RN ,[place]

## 2015-07-28 NOTE — Progress Notes (Addendum)
Heart Failure Navigator Consult Note  Presentation: Shannon Mcclain 8X with HT, NICM (EF 40-45%), MR s/p bioprosthetic MVR (06/30/15, [facility]) c/b POAF tx'd with DCCV and amiodarone/warfarin with intermittent bradycardia requiring temporary wire but no permanent pacemaker, COPD on 2L home oxygen, who presents with poorly controlled HTN, chest pain, and mild HF.    Past Medical History  Diagnosis Date  . Abnormality of gait   . COPD (chronic obstructive pulmonary disease)   . Hypertension   . Arthritis 01/05/2013    left  . CHF (congestive heart failure) 04/22/2015    NYHA class II, chronic, systolic    Social History   Social History  . Marital Status: Widowed    Spouse Name: N/A  . Number of Children: N/A  . Years of Education: N/A   Social History Main Topics  . Smoking status: Never Smoker   . Smokeless tobacco: None  . Alcohol Use: No  . Drug Use: No  . Sexual Activity: No   Other Topics Concern  . None   Social History Narrative    ECHO: none noted on chart  BNP    Component Value Date/Time   BNP 287.1* 07/27/2015 0532    ProBNP No results found for: PROBNP   Education Assessment and Provision:  Detailed education and instructions provided on heart failure disease management including the following:  Signs and symptoms of Heart Failure When to call the physician Importance of daily weights Low sodium diet Fluid restriction Medication management Anticipated future follow-up appointments  Patient education given on each of the above topics.  Patient acknowledges understanding and acceptance of all instructions.  I spoke briefly with patient regarding her HF.  She has been at [facility] prior to admission.  She can teach back all topics listed above.  I reviewed a low sodium diet and high sodium foods to avoid.  She has a scale, can weigh daily and is aware of the signs and symptoms of heart failure.  She follows with a cardiologist in [place] and  plans to move back there after being discharged from [place].  I have encouraged her to call me with questions after discharge if needed.  Education Materials:  "Living Better With Heart Failure" Booklet, Daily Weight Tracker Tool    High Risk Criteria for Readmission and/or Poor Patient Outcomes:   EF <30%- echo not noted in chart  2 or more admissions in 6 months-No  Difficult social situation- No -has been living at [facility] medication noncompliance- No   Barriers of Care:  -  Knowledge and compliance  Discharge Planning:  Plans to return to SNF

## 2015-07-28 NOTE — Progress Notes (Signed)
Patient seen and examined. Continue to be stable and appropriate for discharge to SNF. VSS. Discharge summary and medication reconciliation has been reviewed and appropriate. No changes to plan of care needed. Ok for discharge.   Vassie Loll [phone]

## 2015-07-28 NOTE — Evaluation (Signed)
Occupational Therapy Evaluation and Discharge Patient Details Name: Shannon Mcclain MRN: [id number] DOB: January 29, 1951 Today's Date: 07/28/2015    History of Present Illness Patient is a 64 yo female admitted 07/24/15 with SOB and chest pain.  Patient with CHF exacerbation, pleural effusion, HTN.  PMH:  NICM, MVR on 06/30/15, COPD on 2 liters O2, HTN, PAF, arthritis   Clinical Impression   This 64 yo female admitted with above presents to acute OT with decreased activity tolerance, sternal precautions, decreased mobility, and decreased balance all affecting her ability to care for herself at home at an independent level as she was prior to her MVR surgery on 06/30/15. She will benefit from continued OT at SNF to get to a Mod I/independent level to return home alone. Acute OT will sign off.    Follow Up Recommendations  SNF    Equipment Recommendations   (TBD at SNF)       Precautions / Restrictions Precautions Precautions: Fall;Sternal Precaution Comments: Reviewed sternal precautions with patient.  Patient can recall precautions, and has her pillow with her. Restrictions Weight Bearing Restrictions: Yes Other Position/Activity Restrictions: sternal precautions      Mobility Bed Mobility Overal bed mobility: Needs Assistance Bed Mobility: Rolling;Sidelying to Sit Rolling: Min assist Sidelying to sit: Min assist       General bed mobility comments: VCs for sequencing  Transfers Overall transfer level: Needs assistance   Transfers: Sit to/from Stand;Stand Pivot Transfers Sit to Stand: Min assist Stand pivot transfers: Min assist       General transfer comment: with me standing in front of her and her holding onto my upper arms    Balance Overall balance assessment: Needs assistance Sitting-balance support: No upper extremity supported;Feet supported Sitting balance-Leahy Scale: Good     Standing balance support: Bilateral upper extremity supported Standing  balance-Leahy Scale: Poor                              ADL Overall ADL's : Needs assistance/impaired Eating/Feeding: Independent;Sitting   Grooming: Set up;Sitting   Upper Body Bathing: Set up;Sitting   Lower Body Bathing: Moderate assistance (with min A sit<>stand)   Upper Body Dressing : Set up;Supervision/safety;Sitting   Lower Body Dressing: Moderate assistance (with min A sit<>stand)   Toilet Transfer: Minimal assistance;Stand-pivot (bed>recliner)   Toileting- Clothing Manipulation and Hygiene: Minimal assistance (with min A sit<>stand)               Vision Additional Comments: No change from baseline          Pertinent Vitals/Pain Pain Assessment: No/denies pain     Hand Dominance Right   Extremity/Trunk Assessment Upper Extremity Assessment Upper Extremity Assessment: Overall WFL for tasks assessed           Communication Communication Communication: No difficulties   Cognition Arousal/Alertness: Awake/alert Behavior During Therapy: WFL for tasks assessed/performed Overall Cognitive Status: Within Functional Limits for tasks assessed                                Home Living Family/patient expects to be discharged to:: Skilled nursing facility                                             OT Diagnosis: Generalized  weakness   OT Problem List: Decreased strength;Cardiopulmonary status limiting activity;Impaired balance (sitting and/or standing) (impaired UE functional use due to sternal precautions)      OT Goals(Current goals can be found in the care plan section) Acute Rehab OT Goals Patient Stated Goal: to get back to rehab then home  OT Frequency:                End of Session    Activity Tolerance: Patient tolerated treatment well Patient left: in chair;with call bell/phone within reach   Time: 0917-0936 OT Time Calculation (min): 19 min Charges:  OT General Charges $OT Visit: 1  Procedure OT Evaluation $Initial OT Evaluation Tier I: 1 Procedure  Evette Georges [phone] 07/28/2015, 9:47 AM

## 2015-07-28 NOTE — Care Management Important Message (Signed)
Important Message  Patient Details  Name: Shannon Mcclain MRN: [id number] Date of Birth: 06-10-1951   Medicare Important Message Given:  Yes-second notification given    Yvonna Alanis 07/28/2015, 3:01 PM

## 2015-07-29 ENCOUNTER — Non-Acute Institutional Stay (SKILLED_NURSING_FACILITY): Payer: Medicare HMO | Admitting: Internal Medicine

## 2015-07-29 ENCOUNTER — Encounter: Payer: Self-pay | Admitting: Internal Medicine

## 2015-07-29 DIAGNOSIS — I5022 Chronic systolic (congestive) heart failure: Secondary | ICD-10-CM | POA: Diagnosis not present

## 2015-07-29 DIAGNOSIS — I480 Paroxysmal atrial fibrillation: Secondary | ICD-10-CM | POA: Diagnosis not present

## 2015-07-29 DIAGNOSIS — J438 Other emphysema: Secondary | ICD-10-CM | POA: Diagnosis not present

## 2015-07-29 DIAGNOSIS — I10 Essential (primary) hypertension: Secondary | ICD-10-CM

## 2015-07-29 DIAGNOSIS — I340 Nonrheumatic mitral (valve) insufficiency: Secondary | ICD-10-CM

## 2015-07-29 DIAGNOSIS — R040 Epistaxis: Secondary | ICD-10-CM

## 2015-07-29 DIAGNOSIS — E876 Hypokalemia: Secondary | ICD-10-CM | POA: Diagnosis not present

## 2015-07-29 DIAGNOSIS — R5381 Other malaise: Secondary | ICD-10-CM | POA: Diagnosis not present

## 2015-07-29 DIAGNOSIS — Z7901 Long term (current) use of anticoagulants: Secondary | ICD-10-CM

## 2015-07-29 NOTE — Discharge Summary (Signed)
PCP: No primary care provider on file.  Admit date: 07/24/2015 Discharge date: 07/28/2015  Time spent: >30 minutes  Recommendations for Outpatient Follow-up:  Check weight on daily basis Follow low sodium diet (< 2 gram daily) Take medications as prescribed BMET in 5 days to follow electrolytes and renal function  Please MD at facility to reassess BP and adjust antihypertensive agents as needed  Discharge Diagnoses:  Principal Problem:  Systolic CHF, acute on chronic Active Problems:  Mitral regurgitation S/P Mitral Valve Replacement  COPD (chronic obstructive pulmonary disease)  PAF (paroxysmal atrial fibrillation)  Hypertensive urgency  CHF (congestive heart failure)  Chest pain   Discharge Condition: stable and improved. Will discharge back to SNF for further rehab and care. Follow up with Cardiologist in 10 days  Diet recommendation: heart healthy diet  Filed Weights   07/25/15 0558 07/26/15 0514 07/27/15 0613  Weight: 77.293 kg (170 lb 6.4 oz) 78.563 kg (173 lb 3.2 oz) 77.747 kg (171 lb 6.4 oz)    History of present illness:  64F with HT, NICM (EF 40-45%), MR s/p bioprosthetic MVR (06/30/15, [place]) c/b POAF tx'd with DCCV and amiodarone/warfarin with intermittent bradycardia requiring temporary wire but no permanent pacemaker, COPD on 2L home oxygen, who presents with poorly controlled HTN, chest pain, and CHF exacerbation.Marland Kitchen    Hospital Course:  1-Systolic CHF, acute on chronic: due to uncontrolled BP most likely; also not adherence to low sodium diet. -significant improvement with IV lasix treatment -patient is roughly 12 pounds off since admission -renal function went up slightly during IV diuresis, but back to normal at discharge -lasix dose has been adjusted to  daily -continue low sodium diet and daily weight -BMET in 5 days to follow electrolytes and renal function   2-PAF (paroxysmal atrial fibrillation): rate  controlled -CHADsVASC score: 3 -on coumadin -continue coreg and amiodarone, per cardiology rec's last one change to 200 mg daily  4-HTN/hypertensive urgency: with uncontrolled BP on admission -will discharge on losartan  daily, Coreg 12.5 mg BID and hydralazine  TID -patient will also has lasix adjusted to  daily -advise to follow low sodium diet  5-acute on chronic resp failure: in setting of CHF exacerbation. Patient chronically on 2L of O2 supplementation. Chronic resp failure due to COPD. -will continue symbicort -will continue O2 supplementation -no wheezing on exam and with good air movement  -breathing better and back to baseline after improvement on CHF control  6-Chest pain: most likely MSK after valve replacement vs demand ischemia with uncontrolled hypertension and CHF exacerbation. -troponin neg X 3 -currently CP free -patient diuretics dose adjusted and started on hydralazine for better BP control   7-Mitral regurgitation S/P Mitral Valve Replacement: healing and recovering ok -follow up with outpatient cardiologist at [facility]: will continue famotidine   Procedures:  ECHO: not done during this admission -recently done at [facility] (EF 40-45%)  Consultations:  Cardiology  Discharge Exam: Filed Vitals:   07/27/15 0911  BP: 147/91  Pulse: 91  Temp:   Resp:    General: Alert, awake, oriented x3, in no acute distress. Endorses her breathing is back to normal. Denies CP  HEENT: No bruits, no goiter, no JVD Heart: Regular rate, no rubs or gallops, positive murmur. Chest incision healing properly and w/o signs of infection. No LE edema. Lungs: improved air movement, no wheezing and no frank crackles appreciated on exam  Abdomen: Soft, nontender, nondistended, positive bowel sounds.  Neuro: Grossly intact, nonfocal.   Discharge Instructions  Discharge Instructions    Diet - low sodium heart healthy  Complete by:  As directed      Discharge instructions  Complete by: As directed   Check weight on daily basis Follow low sodium diet (< 2 gram daily) Take medications as prescribed BMET in 5 days to follow electrolytes and renal function  Please MD at facility to reassess BP and adjust antihypertensive agents as needed          Current Discharge Medication List    START taking these medications   Details  hydrALAZINE (APRESOLINE) 10 MG tablet Take 1 tablet (10 mg total) by mouth every 8 (eight) hours.      CONTINUE these medications which have CHANGED   Details  amiodarone (PACERONE) 400 MG tablet Take 0.5 tablets (200 mg total) by mouth daily.    furosemide (LASIX) 20 MG tablet Take 2 tablets (40 mg total) by mouth daily. Qty: 30 tablet    oxyCODONE (OXY IR/ROXICODONE) 5 MG immediate release tablet Take 1 tablet (5 mg total) by mouth every 6 (six) hours as needed for severe pain. Qty: 15 tablet, Refills: 0      CONTINUE these medications which have NOT CHANGED   Details  aspirin EC 81 MG tablet Take 81 mg by mouth daily.    budesonide-formoterol (SYMBICORT) 160-4.5 MCG/ACT inhaler Inhale 2 puffs into the lungs 2 (two) times daily.    carvedilol (COREG) 12.5 MG tablet Take 12.5 mg by mouth 2 (two) times daily with a meal.    famotidine (PEPCID) 20 MG tablet Take 20 mg by mouth 2 (two) times daily.    losartan (COZAAR) 50 MG tablet Take 50 mg by mouth daily.     potassium chloride SA (K-DUR,KLOR-CON) 20 MEQ tablet Take 10 mEq by mouth daily.     !! warfarin (COUMADIN) 1 MG tablet Take 0.5 mg by mouth daily. Take with warfarin 4 mg    !! warfarin (COUMADIN) 4 MG tablet Take 4 mg by mouth daily. Take with 0.5 tablet of warfarin 1 mg    !! - Potential duplicate medications found. Please discuss with provider.     Allergies  Allergen Reactions  . Penicillins Shortness Of Breath  . Sulfa Antibiotics  Itching  . Codeine Swelling     The results of significant diagnostics from this hospitalization (including imaging, microbiology, ancillary and laboratory) are listed below for reference.    Significant Diagnostic Studies:  Imaging Results    Ct Angio Head W/cm &/or Wo Cm  07/17/2015 CLINICAL DATA: 64 year old female who awoke with left frontal headache and left side blurred vision. Cardiac surgery in July. Atrial fibrillation. Initial encounter. EXAM: CT ANGIOGRAPHY HEAD TECHNIQUE: Multidetector CT imaging of the head was performed using the standard protocol during bolus administration of intravenous contrast. Multiplanar CT image reconstructions and MIPs were obtained to evaluate the vascular anatomy. CONTRAST: 50mL OMNIPAQUE IOHEXOL 350 MG/ML SOLN COMPARISON: High [facility] head CT without contrast report 04/10/1998 (no images available). FINDINGS: CT HEAD Brain: Cerebral volume is within normal limits for age. No ventriculomegaly. No midline shift, mass effect, or evidence of intracranial mass lesion. Patchy and confluent bilateral cerebral white matter hypodensity. No evidence of cortically based acute infarction identified. No acute intracranial hemorrhage identified. Deep gray matter and posterior fossa gray-white matter differentiation is within normal limits. Calvarium and skull base: No acute osseous abnormality identified. Paranasal sinuses: Visualized paranasal sinuses and mastoids are clear. Orbits: Visualized orbits and scalp soft tissues are within normal  limits. CTA HEAD Posterior circulation: Fairly codominant distal vertebral arteries. Both PICA origins appear patent. Patent vertebrobasilar junction. No basilar artery stenosis. SCA and right PCA origins are within normal limits. Fetal type left PCA origin. Right posterior communicating artery is present. Bilateral PCA branches are within normal limits. Anterior circulation: Both ICA siphons are  patent. No siphon atherosclerosis or stenosis identified. Ophthalmic and posterior communicating artery origins are within normal limits. Carotid termini are patent. MCA and ACA origins are within normal limits. Dominant right ACA A1 segment. Anterior communicating artery and bilateral ACA branches are within normal limits. Left MCA M1 segment, bifurcation, and left MCA branches are within normal limits. Right MCA M1 segment, bifurcation, and right MCA branches are within normal limits. Venous sinuses: Patent. Anatomic variants: Fetal type left PCA origin. Delayed phase:No abnormal enhancement identified. IMPRESSION: 1. Negative intracranial CTA. 2. Moderate nonspecific cerebral white matter changes, most commonly due to chronic small vessel disease. No acute intracranial abnormality. Electronically Signed By: Odessa Fleming M.D. On: 07/17/2015 11:30   Dg Chest 2 View  07/25/2015 CLINICAL DATA: Acute onset of left-sided chest pain, radiating to the left arm. Initial encounter. EXAM: CHEST 2 VIEW COMPARISON: Chest radiograph performed 12/08/2004 FINDINGS: There is elevation of the right hemidiaphragm. A small right pleural effusion is noted. Mild bibasilar airspace opacities could reflect minimal interstitial edema. Underlying vascular congestion is noted. No pneumothorax is seen. The heart is borderline enlarged. The patient is status post median sternotomy. A valve replacement is noted. No acute osseous abnormalities are identified. IMPRESSION: Elevation of the right hemidiaphragm, with small right pleural effusion. Mild bibasilar airspace opacities could reflect minimal interstitial edema. Underlying vascular congestion and borderline cardiomegaly. Electronically Signed By: Roanna Raider M.D. On: 07/25/2015 00:32   Ct Angio Chest Pe W/cm &/or Wo Cm  07/25/2015 CLINICAL DATA: Status post recent mitral valve replacement, with acute onset of generalized chest pain and shortness of  breath. Initial encounter. EXAM: CT ANGIOGRAPHY CHEST WITH CONTRAST TECHNIQUE: Multidetector CT imaging of the chest was performed using the standard protocol during bolus administration of intravenous contrast. Multiplanar CT image reconstructions and MIPs were obtained to evaluate the vascular anatomy. CONTRAST: 80mL OMNIPAQUE IOHEXOL 350 MG/ML SOLN COMPARISON: Chest radiograph performed 07/24/2015 FINDINGS: There is no evidence of central pulmonary embolus. A trace right-sided pleural effusion is noted. Bibasilar airspace opacification, right greater than left, may reflect atelectasis or pneumonia. There is no evidence of pneumothorax. No masses are identified; no abnormal focal contrast enhancement is seen. A mitral valve replacement is noted. Mild calcification along the inferior aspect of the left atrium may reflect small remote infarct. The patient is status post median sternotomy. No mediastinal lymphadenopathy is seen. No pericardial effusion is identified. The great vessels are grossly unremarkable. No axillary lymphadenopathy is seen. The thyroid gland is unremarkable in appearance. The visualized portions of the liver and spleen are unremarkable. Left renal atrophy is noted. No acute osseous abnormalities are seen. Mild degenerative change is noted at the lower cervical spine. Review of the MIP images confirms the above findings. IMPRESSION: 1. No evidence of central pulmonary embolus. 2. Trace right-sided pleural effusion noted. Bibasilar airspace opacification, right greater than left, may reflect atelectasis or pneumonia. 3. Left renal atrophy noted. Electronically Signed By: Roanna Raider M.D. On: 07/25/2015 03:01    Labs: Basic Metabolic Panel:  Last Labs      Recent Labs Lab 07/25/15 0008 07/25/15 0750 07/26/15 0323 07/27/15 0532  NA 136 140 139 140  K 5.7* 3.5 3.6  3.9  CL 100* 98* 96* 97*  CO2 28 33* 33* 34*  GLUCOSE 104* 101* 116*  90  BUN 16 11 18 17   CREATININE 0.69 0.72 1.14* 0.93  CALCIUM 9.0 9.8 9.5 9.4  MG --  1.9 --  --      Liver Function Tests:  Last Labs      Recent Labs Lab 07/25/15 0750  AST 20  ALT 17  ALKPHOS 62  BILITOT 0.7  PROT 7.4  ALBUMIN 3.6     CBC:  Last Labs      Recent Labs Lab 07/25/15 0008 07/25/15 0750  WBC 6.8 6.2  NEUTROABS 5.5 5.0  HGB 10.0* 11.2*  HCT 31.9* 36.3  MCV 81.4 81.8  PLT 414* 363     Cardiac Enzymes:  Last Labs      Recent Labs Lab 07/25/15 0008  TROPONINI 0.03     BNP: BNP (last 3 results)  Recent Labs (within last 365 days)     Recent Labs  07/25/15 0008 07/27/15 0532  BNP 400.8* 287.1*      Signed:  Vassie Loll [facility] 07/27/2015, 9:39 AM          Revision History

## 2015-07-29 NOTE — Progress Notes (Signed)
Patient ID: Shannon Mcclain, female   DOB: 03-Sep-1951, 64 y.o.   MRN: [id number]     [facility]  PCP: No primary care provider on file.  Code Status: Full Code   Allergies  Allergen Reactions  . Penicillins Shortness Of Breath  . Sulfa Antibiotics Itching  . Codeine Swelling    Chief Complaint  Patient presents with  . New Admit To SNF    New Admission      HPI:  64 y.o. patient is here for short term rehabilitation post hospital admission from 07/24/15-07/28/15 with chf exacerbation and uncontrolled HTN. She responded well to diuresis and bp medication adjustment. She was undergoing rehabilitation here post hospital admission from 06/30/15-07/15/15 with mitral valve replacement with a tissue valve on 06/30/15. She is seen in her room today. She is sitting on a wheelchair and has o2. She had nose bleed this am which stopped by pressure application. She feels tired. She has some chest soreness. Denies any other concerns. She has PMH of afib, chronic systolic heart failure, HTN, COPD among others.   Review of Systems:  Constitutional: Negative for fever, chills, diaphoresis.  HENT: Negative for headache, congestion, nasal discharge Eyes: Negative for eye pain, blurred vision, double vision and discharge.  Respiratory: Negative for cough, shortness of breath and wheezing.   Cardiovascular: Negative for chest pain, palpitations, leg swelling.  Gastrointestinal: Negative for heartburn, nausea, vomiting, abdominal pain. Last bowel movement yesterday.  Genitourinary: Negative for dysuria, flank pain.  Musculoskeletal: Negative for back pain, falls Skin: Negative for itching, rash.  Neurological: Negative for dizziness, tingling, focal weakness Psychiatric/Behavioral: Negative for depression   Past Medical History  Diagnosis Date  . Abnormality of gait   . COPD (chronic obstructive pulmonary disease)   . Hypertension   . Arthritis 01/05/2013    left  . CHF (congestive  heart failure) 04/22/2015    NYHA class II, chronic, systolic   Past Surgical History  Procedure Laterality Date  . Joint replacement  08/08/2012  . Cholecystectomy    . Abdominal hysterectomy    . Colon surgery    . Cardiac surgery    . Back surgery     Social History:   reports that she has never smoked. She does not have any smokeless tobacco history on file. She reports that she does not drink alcohol or use illicit drugs.  Family History  Problem Relation Age of Onset  . Stroke Mother   . Hypertension Mother   . Stroke Father     Medications:   Medication List       This list is accurate as of: 07/29/15  2:20 PM.  Always use your most recent med list.               amiodarone 400 MG tablet  Commonly known as:  PACERONE  Take 0.5 tablets (200 mg total) by mouth daily.     aspirin EC 81 MG tablet  Take 81 mg by mouth daily.     budesonide-formoterol 160-4.5 MCG/ACT inhaler  Commonly known as:  SYMBICORT  Inhale 2 puffs into the lungs 2 (two) times daily.     carvedilol 12.5 MG tablet  Commonly known as:  COREG  Take 12.5 mg by mouth 2 (two) times daily with a meal.     famotidine 20 MG tablet  Commonly known as:  PEPCID  Take 20 mg by mouth 2 (two) times daily.     furosemide 20 MG tablet  Commonly known as:  LASIX  Take 2 tablets (40 mg total) by mouth daily.     hydrALAZINE 10 MG tablet  Commonly known as:  APRESOLINE  Take 1 tablet (10 mg total) by mouth every 8 (eight) hours.     losartan 50 MG tablet  Commonly known as:  COZAAR  Take 50 mg by mouth daily.     oxyCODONE 5 MG immediate release tablet  Commonly known as:  Oxy IR/ROXICODONE  Take 1 tablet (5 mg total) by mouth every 6 (six) hours as needed for severe pain.     potassium chloride SA 20 MEQ tablet  Commonly known as:  K-DUR,KLOR-CON  Take 10 mEq by mouth daily.     warfarin 4 MG tablet  Commonly known as:  COUMADIN  Take 4 mg by mouth daily. Take with 0.5 tablet of warfarin 1  mg     warfarin 1 MG tablet  Commonly known as:  COUMADIN  Take 0.5 mg by mouth daily. Take with warfarin 4 mg         Physical Exam: Filed Vitals:   07/29/15 1415  BP: 120/76  Pulse: 66  Temp: 98.2 F (36.8 C)  TempSrc: Oral  Resp: 20  SpO2: 100%    General- elderly female,  in no acute distress Head- normocephalic, atraumatic Throat- moist mucus membrane Nose- dried up blood in nares, on o2 Eyes- PERRLA, EOMI, no pallor, no icterus, no discharge, normal conjunctiva, normal sclera Neck- no cervical lymphadenopathy, no jugular vein distension Cardiovascular- irregular heart rate, no murmurs, palpable dorsalis pedis and radial pulses, trace leg edema Respiratory- bilateral clear to auscultation, no wheeze, no rhonchi, no crackles, no use of accessory muscles Abdomen- bowel sounds present, soft, non tender Musculoskeletal- able to move all 4 extremities, generalized weakness Neurological- no focal deficit, alert and oriented to person, place and time Skin- warm and dry, sternal incision healing well Psychiatry- normal mood and affect   Labs reviewed: Basic Metabolic Panel:  Recent Labs  16/10/96 0750 07/26/15 0323 07/27/15 0532  NA 140 139 140  K 3.5 3.6 3.9  CL 98* 96* 97*  CO2 33* 33* 34*  GLUCOSE 101* 116* 90  BUN CREATININE 0.72 1.14* 0.93  CALCIUM 9.8 9.5 9.4  MG 1.9  --   --    Liver Function Tests:  Recent Labs  07/25/15 0750  AST 20  ALT 17  ALKPHOS 62  BILITOT 0.7  PROT 7.4  ALBUMIN 3.6   No results for input(s): LIPASE, AMYLASE in the last 8760 hours. No results for input(s): AMMONIA in the last 8760 hours. CBC:  Recent Labs  07/17/15 1015 07/17/15 1024 07/25/15 0008 07/25/15 0750  WBC 8.6  --  6.8 6.2  NEUTROABS  --   --  5.5 5.0  HGB 10.8* 12.2 10.0* 11.2*  HCT 34.4* 36.0 31.9* 36.3  MCV 81.7  --  81.4 81.8  PLT 437*  --  414* 363   Cardiac Enzymes:  Recent Labs  07/25/15 0008  TROPONINI 0.03    BNP: Invalid input(s): POCBNP CBG: No results for input(s): GLUCAP in the last 8760 hours.  Radiological Exams: Dg Chest 2 View  07/25/2015   CLINICAL DATA:  Acute onset of left-sided chest pain, radiating to the left arm. Initial encounter.  EXAM: CHEST  2 VIEW  COMPARISON:  Chest radiograph performed 12/08/2004  FINDINGS: There is elevation of the right hemidiaphragm. A small right pleural effusion is noted. Mild bibasilar airspace  opacities could reflect minimal interstitial edema. Underlying vascular congestion is noted. No pneumothorax is seen.  The heart is borderline enlarged. The patient is status post median sternotomy. A valve replacement is noted. No acute osseous abnormalities are identified.  IMPRESSION: Elevation of the right hemidiaphragm, with small right pleural effusion. Mild bibasilar airspace opacities could reflect minimal interstitial edema. Underlying vascular congestion and borderline cardiomegaly.   Electronically Signed   By: Roanna Raider M.D.   On: 07/25/2015 00:32   Ct Angio Chest Pe W/cm &/or Wo Cm  07/25/2015   CLINICAL DATA:  Status post recent mitral valve replacement, with acute onset of generalized chest pain and shortness of breath. Initial encounter.  EXAM: CT ANGIOGRAPHY CHEST WITH CONTRAST  TECHNIQUE: Multidetector CT imaging of the chest was performed using the standard protocol during bolus administration of intravenous contrast. Multiplanar CT image reconstructions and MIPs were obtained to evaluate the vascular anatomy.  CONTRAST:  80mL OMNIPAQUE IOHEXOL 350 MG/ML SOLN  COMPARISON:  Chest radiograph performed 07/24/2015  FINDINGS: There is no evidence of central pulmonary embolus.  A trace right-sided pleural effusion is noted. Bibasilar airspace opacification, right greater than left, may reflect atelectasis or pneumonia. There is no evidence of pneumothorax. No masses are identified; no abnormal focal contrast enhancement is seen.  A mitral valve  replacement is noted. Mild calcification along the inferior aspect of the left atrium may reflect small remote infarct. The patient is status post median sternotomy. No mediastinal lymphadenopathy is seen. No pericardial effusion is identified. The great vessels are grossly unremarkable. No axillary lymphadenopathy is seen. The thyroid gland is unremarkable in appearance.  The visualized portions of the liver and spleen are unremarkable. Left renal atrophy is noted.  No acute osseous abnormalities are seen. Mild degenerative change is noted at the lower cervical spine.  Review of the MIP images confirms the above findings.  IMPRESSION: 1. No evidence of central pulmonary embolus. 2. Trace right-sided pleural effusion noted. Bibasilar airspace opacification, right greater than left, may reflect atelectasis or pneumonia. 3. Left renal atrophy noted.   Electronically Signed   By: Roanna Raider M.D.   On: 07/25/2015 03:01    Assessment/Plan  Physical deconditioning Will have her work with physical therapy and occupational therapy team to help with gait training and muscle strengthening exercises.fall precautions. Skin care. Encourage to be out of bed.   CHF Appears euvolemic. Continue coreg 12.5 mg bid, lasix 40 mg daily, hydralazine 10 mg tid, losartan 50 mg daily, daily weights and o2  HTN Stable bp reading, monitor bp. Continue coreg, hydralazine and losartan for now  afib Rate controlled. Continue coreg 12.5 mg daily and amiodarone 200 mg daily. Continue coumadin with goal inr 2-3  Epistaxis inr therapeutic. Her o2 likely causing this. To use o2 with humidifier and add nasal saline spray bid for now  Long term anticoagulation inr today 2.2. Continue coumadin 4.5 mg daily and check inr 07/30/15  Mitral regurgitation  S/P mitral valve replacement. Surgical incision healing well. Has follow up with Dr Wynonia Hazard. continue oxycodone 5 mg q6h prn chest soreness. Continue aspirin 81 mg  daily  Hypokalemia Check bmp, continue kcl supplement  COPD Continue o2, continue Symbicort bid and monitor   Goals of care: short term rehabilitation   Labs/tests ordered: bmp, cbc 08/01/15  Family/ staff Communication: reviewed care plan with patient and nursing supervisor    Oneal Grout, MD  [facility] [phone] (Monday-Friday 8 am - 5 pm) [phone] (afterhours)

## 2015-08-04 LAB — BASIC METABOLIC PANEL
BUN: 18 mg/dL (ref 4–21)
Creatinine: 0.7 mg/dL (ref 0.5–1.1)
Glucose: 92 mg/dL
Potassium: 4.5 mmol/L (ref 3.4–5.3)
SODIUM: 140 mmol/L (ref 137–147)

## 2015-08-04 LAB — CBC AND DIFFERENTIAL
HCT: 34 % — AB (ref 36–46)
Hemoglobin: 10.7 g/dL — AB (ref 12.0–16.0)
Platelets: 325 10*3/uL (ref 150–399)
WBC: 5 10^3/mL

## 2015-08-04 LAB — LIPID PANEL
CHOLESTEROL: 152 mg/dL (ref 0–200)
HDL: 52 mg/dL (ref 35–70)
LDL Cholesterol: 83 mg/dL
TRIGLYCERIDES: 86 mg/dL (ref 40–160)

## 2015-08-04 LAB — HEPATIC FUNCTION PANEL
ALT: 13 U/L (ref 7–35)
AST: 15 U/L (ref 13–35)
Alkaline Phosphatase: 51 U/L (ref 25–125)
BILIRUBIN, TOTAL: 0.4 mg/dL

## 2015-08-05 ENCOUNTER — Non-Acute Institutional Stay (SKILLED_NURSING_FACILITY): Payer: Medicare HMO | Admitting: Adult Health

## 2015-08-05 ENCOUNTER — Encounter: Payer: Self-pay | Admitting: Adult Health

## 2015-08-05 DIAGNOSIS — Z7901 Long term (current) use of anticoagulants: Secondary | ICD-10-CM

## 2015-08-05 DIAGNOSIS — I480 Paroxysmal atrial fibrillation: Secondary | ICD-10-CM

## 2015-08-06 NOTE — Progress Notes (Signed)
Patient ID: Shannon Mcclain, female   DOB: March 26, 1951, 64 y.o.   MRN: [id number] Subjective:     Indication: atrial fibrillation Bleeding signs/symptoms: None Thromboembolic signs/symptoms: None  Missed Coumadin doses: None Medication changes: no Dietary changes: no Bacterial/viral infection: no Other concerns: none   Review of Systems A comprehensive review of systems was negative.   Objective:    INR Today: 3.0     8/15=2.0       8/17=2.6 Current dose:  Coumadin 4.5 mg daily  Assessment:    Therapeutic INR for goal of 2-3   Plan:    1. New dose: decrease Coumadin to 4 mg daily   2. Next INR:  08/08/15

## 2015-08-15 ENCOUNTER — Non-Acute Institutional Stay: Payer: Medicare HMO | Admitting: Adult Health

## 2015-08-15 ENCOUNTER — Encounter: Payer: Self-pay | Admitting: Adult Health

## 2015-08-15 DIAGNOSIS — E876 Hypokalemia: Secondary | ICD-10-CM

## 2015-08-15 DIAGNOSIS — R5381 Other malaise: Secondary | ICD-10-CM | POA: Diagnosis not present

## 2015-08-15 DIAGNOSIS — Z7901 Long term (current) use of anticoagulants: Secondary | ICD-10-CM

## 2015-08-15 DIAGNOSIS — I5022 Chronic systolic (congestive) heart failure: Secondary | ICD-10-CM | POA: Diagnosis not present

## 2015-08-15 DIAGNOSIS — J449 Chronic obstructive pulmonary disease, unspecified: Secondary | ICD-10-CM | POA: Diagnosis not present

## 2015-08-15 DIAGNOSIS — D62 Acute posthemorrhagic anemia: Secondary | ICD-10-CM | POA: Diagnosis not present

## 2015-08-15 DIAGNOSIS — I340 Nonrheumatic mitral (valve) insufficiency: Secondary | ICD-10-CM | POA: Diagnosis not present

## 2015-08-15 DIAGNOSIS — I10 Essential (primary) hypertension: Secondary | ICD-10-CM

## 2015-08-16 NOTE — Progress Notes (Signed)
Patient ID: Shannon Mcclain, female   DOB: Apr 23, 1951, 64 y.o.   MRN: [id number]    DATE:  08/15/15 MRN:  [id number]  BIRTHDAY: 08/01/1951  Facility:  Nursing Home Location:  [facility]  Nursing Home Room Number: 902-P  LEVEL OF CARE:  SNF (31)  Contact Information    Name Relation Home Work Mobile   Roman,Katina Daughter   [phone]      Chief Complaint  Patient presents with  . Discharge Note    Physical deconditioning, S/P mitral valve replacement, atrial fibrillation, hypertension, anemia, hypokalemia, CHF, long-term use of anticoagulant and COPD    HISTORY OF PRESENT ILLNESS:  This is a 64 year old female who is for discharge home with home health PT, OT, CNA, nursing and social worker. DME:   Standard rolling walker with seat and bedside commode. She has been admitted to [facility] on 07/15/15 from [facility]. She had mitral valve replacement with a tissue valve on 06/30/15. She uses O2 at home at 2 L. She had rate-controlled atrial fibrillation on postoperative day 2. She had bradycardia on postop day 3 requiring  pacing with a temporary pacemaker. Cardiology recommended cardioversion and was done on 7/22. It was initially successful but she is eventually converted back to a rate controlled atrial fibrillation. She then converted to intermittent atrial fibrillation with some bigeminy and eventually remained sinus rhythm.  She was readmitted to [facility] on 07/28/15 from being hospitalized on 8/11 - 8/15 due to CHF exacerbation and uncontrolled hypertension. She was diuresed and had BP medication adjustment.  Patient was admitted to this facility for short-term rehabilitation after the patient's recent hospitalization.  Patient has completed SNF rehabilitation and therapy has cleared the patient for discharge.  PAST MEDICAL HISTORY:  Past Medical History  Diagnosis Date  . Abnormality of gait   . COPD (chronic obstructive pulmonary disease)   .  Hypertension   . Arthritis 01/05/2013    left  . CHF (congestive heart failure) 04/22/2015    NYHA class II, chronic, systolic     CURRENT MEDICATIONS: Reviewed  Patient's Medications  New Prescriptions   No medications on file  Previous Medications   AMIODARONE (PACERONE) 400 MG TABLET    Take 0.5 tablets (200 mg total) by mouth daily.   ASPIRIN EC 81 MG TABLET    Take 81 mg by mouth daily.   BUDESONIDE-FORMOTEROL (SYMBICORT) 160-4.5 MCG/ACT INHALER    Inhale 2 puffs into the lungs 2 (two) times daily.   CARVEDILOL (COREG) 12.5 MG TABLET    Take 12.5 mg by mouth 2 (two) times daily with a meal.   FAMOTIDINE (PEPCID) 20 MG TABLET    Take 20 mg by mouth 2 (two) times daily.   FUROSEMIDE (LASIX) 20 MG TABLET    Take 2 tablets (40 mg total) by mouth daily.   HYDRALAZINE (APRESOLINE) 25 MG TABLET    Take 25 mg by mouth every 6 (six) hours. 6 am, 12 noon, 6 pm, & 12 midnight   LOSARTAN (COZAAR) 50 MG TABLET    Take 50 mg by mouth daily.    OXYCODONE (OXY IR/ROXICODONE) 5 MG IMMEDIATE RELEASE TABLET    Take 1 tablet (5 mg total) by mouth every 6 (six) hours as needed for severe pain.   POTASSIUM CHLORIDE SA (K-DUR,KLOR-CON) 20 MEQ TABLET    Take 10 mEq by mouth daily.    WARFARIN (COUMADIN) 4 MG TABLET    Take 4 mg by mouth daily.  Modified Medications   No medications on file  Discontinued Medications   No medications on file     Allergies  Allergen Reactions  . Penicillins Shortness Of Breath  . Sulfa Antibiotics Itching  . Codeine Swelling     REVIEW OF SYSTEMS:  GENERAL: no change in appetite, no fatigue, no weight changes, no fever, chills or weakness EYES: Denies change in vision, dry eyes, eye pain, itching or discharge EARS: Denies change in hearing, ringing in ears, or earache NOSE: Denies nasal congestion or epistaxis MOUTH and THROAT: Denies oral discomfort, gingival pain or bleeding, pain from teeth or hoarseness   RESPIRATORY: no cough, SOB, DOE, wheezing,  hemoptysis CARDIAC: no chest pain, edema or palpitations GI: no abdominal pain, diarrhea, constipation, heart burn, nausea or vomiting GU: Denies dysuria, frequency, hematuria, incontinence, or discharge PSYTCHIATRIC: Denies feeling of depression or anxiety. No report of hallucinations, insomnia, paranoia, or agitation   PHYSICAL EXAMINATION  GENERAL APPEARANCE: Well nourished. In no acute distress. Normal body habitus SKIN:   Surgical incision on midline chest is dry, no redness HEAD: Normal in size and contour. No evidence of trauma EYES: Lids open and close normally. No blepharitis, entropion or ectropion. PERRL. Conjunctivae are clear and sclerae are white. Lenses are without opacity EARS: Pinnae are normal. Patient hears normal voice tunes of the examiner MOUTH and THROAT: Lips are without lesions. Oral mucosa is moist and without lesions. Tongue is normal in shape, size, and color and without lesions NECK: supple, trachea midline, no neck masses, no thyroid tenderness, no thyromegaly LYMPHATICS: no LAN in the neck, no supraclavicular LAN RESPIRATORY: breathing is even & unlabored, BS CTAB CARDIAC: RRR, no murmur,no extra heart sounds, no edema GI: abdomen soft, normal BS, no masses, no tenderness, no hepatomegaly, no splenomegaly EXTREMITIES:  Able to move 4 extremities PSYCHIATRIC: Alert and oriented X 3. Affect and behavior are appropriate  LABS/RADIOLOGY: Labs reviewed: Basic Metabolic Panel:  Recent Labs  54/09/81 0750 07/26/15 0323 07/27/15 0532 08/04/15  NA 140 139 140 140  K 3.5 3.6 3.9 4.5  CL 98* 96* 97*  --   CO2 33* 33* 34*  --   GLUCOSE 101* 116* 90  --   BUN 11 18 17 18   CREATININE 0.72 1.14* 0.93 0.7  CALCIUM 9.8 9.5 9.4  --   MG 1.9  --   --   --    CBC:  Recent Labs  07/17/15 1015  07/25/15 0008 07/25/15 0750 08/04/15  WBC 8.6  --  6.8 6.2 5.0  NEUTROABS  --   --  5.5 5.0  --   HGB 10.8*  < > 10.0* 11.2* 10.7*  HCT 34.4*  < > 31.9* 36.3 34*    MCV 81.7  --  81.4 81.8  --   PLT 437*  --  414* 363 325  < > = values in this interval not displayed.  ASSESSMENT/PLAN:  Physical deconditioning - for home health PT, OT, CNA, nursing and social worker  Mitral regurgitation S/P mitral valve replacement - continue oxycodone 5 mg 1 tab by mouth every 4 hours when necessary for pain; follow-up with Dr. Gwen Her, [facility])  Atrial fibrillation - rate controlled; continue Coumadin, Amiodarone 200 mg 1 tab by mouth every 12 hours, Coreg 12.5 mg by mouth twice a day and Coumadin  Hypertension - well controlled; continue Apresoline 25 mg 1 tab PO Q 8 hours,  Coreg 12.5 mg 1 tab by mouth twice a day and  Cozaar 50 mg 1 tab by mouth daily  Acute blood loss anemia - hemoglobin 10.7; stable  Hypokalemia - K4.5; continue KCl 1 tab by mouth daily  Chronic systolic heart failure - continue Lasix 40 mg 1 tab by mouth daily  COPD - continue Symbicort 180-4.5 g/ACT 2 puffs into lungs twice a day; continue O2 @ 2L/min via [place]; she verbalized that she has O2 concentrator @ home  Long-term use of anticoagulant - INR 1.8, subtherapeutic; increase Coumadin to 4.5 mg daily and check INR on 08/19/15     I have filled out patient's discharge paperwork and written prescriptions.  Patient will receive home health PT, OT, Nursing, SW and CNA.  DME provided:  Standard rolling walker with seat and bedside commode  Total discharge time: Greater than 30 minutes  Discharge time involved coordination of the discharge process with social worker, nursing staff and therapy department. Medical justification for home health services/DME verified.   [facility], NP [employer] [phone]

## 2015-08-20 DIAGNOSIS — I5022 Chronic systolic (congestive) heart failure: Secondary | ICD-10-CM | POA: Diagnosis not present

## 2015-08-20 DIAGNOSIS — J449 Chronic obstructive pulmonary disease, unspecified: Secondary | ICD-10-CM | POA: Diagnosis not present

## 2015-08-20 DIAGNOSIS — Z48812 Encounter for surgical aftercare following surgery on the circulatory system: Secondary | ICD-10-CM | POA: Diagnosis not present

## 2015-08-20 DIAGNOSIS — I10 Essential (primary) hypertension: Secondary | ICD-10-CM | POA: Diagnosis not present

## 2016-03-04 IMAGING — CR DG CHEST 2V
2 series · 2 of 2 positions shown · non-contrast
Comparison: Chest radiograph performed 12/08/2004

CLINICAL DATA: Acute onset of left-sided chest pain, radiating to
the left arm. Initial encounter.

EXAM:
CHEST  2 VIEW

[chest lat]
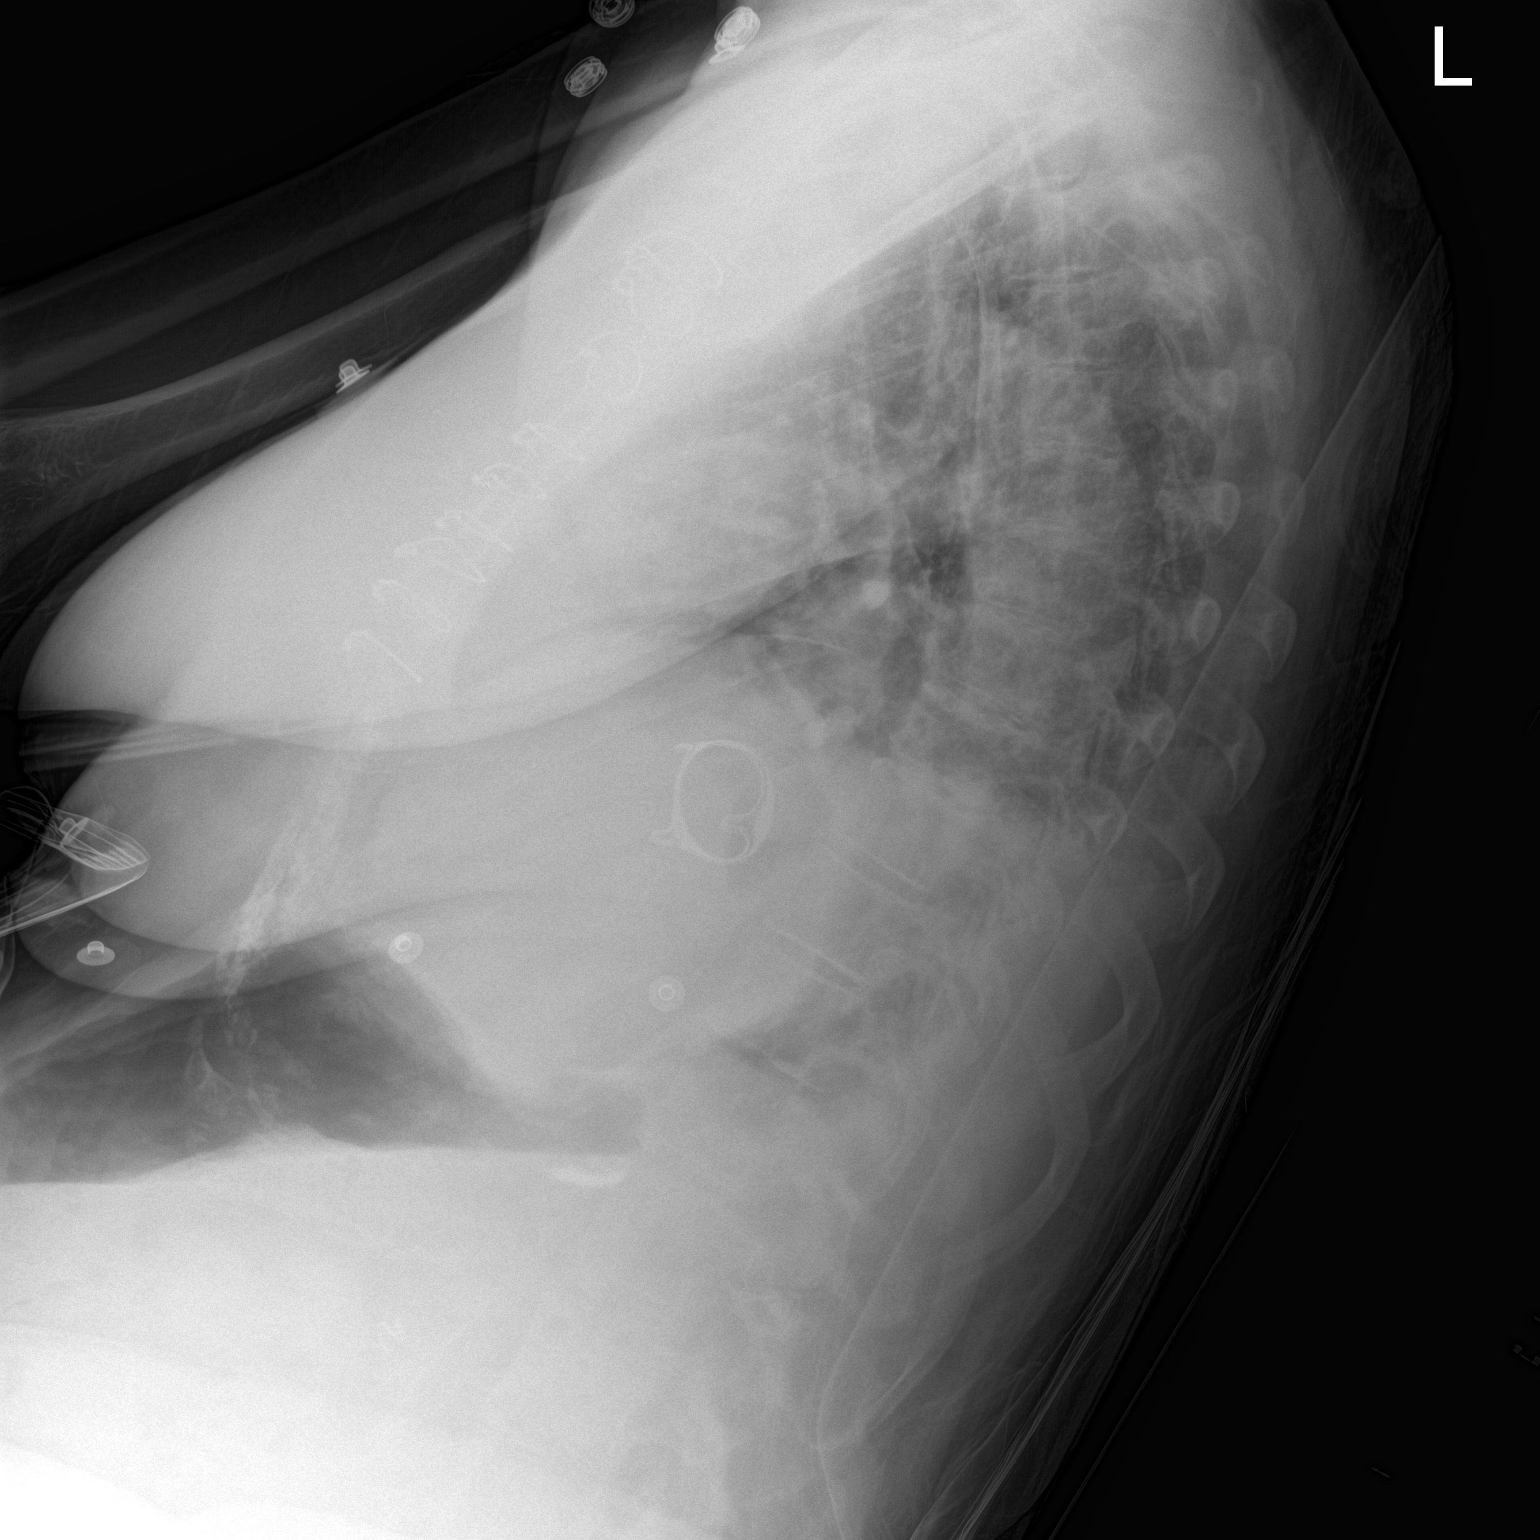

[chest ap]
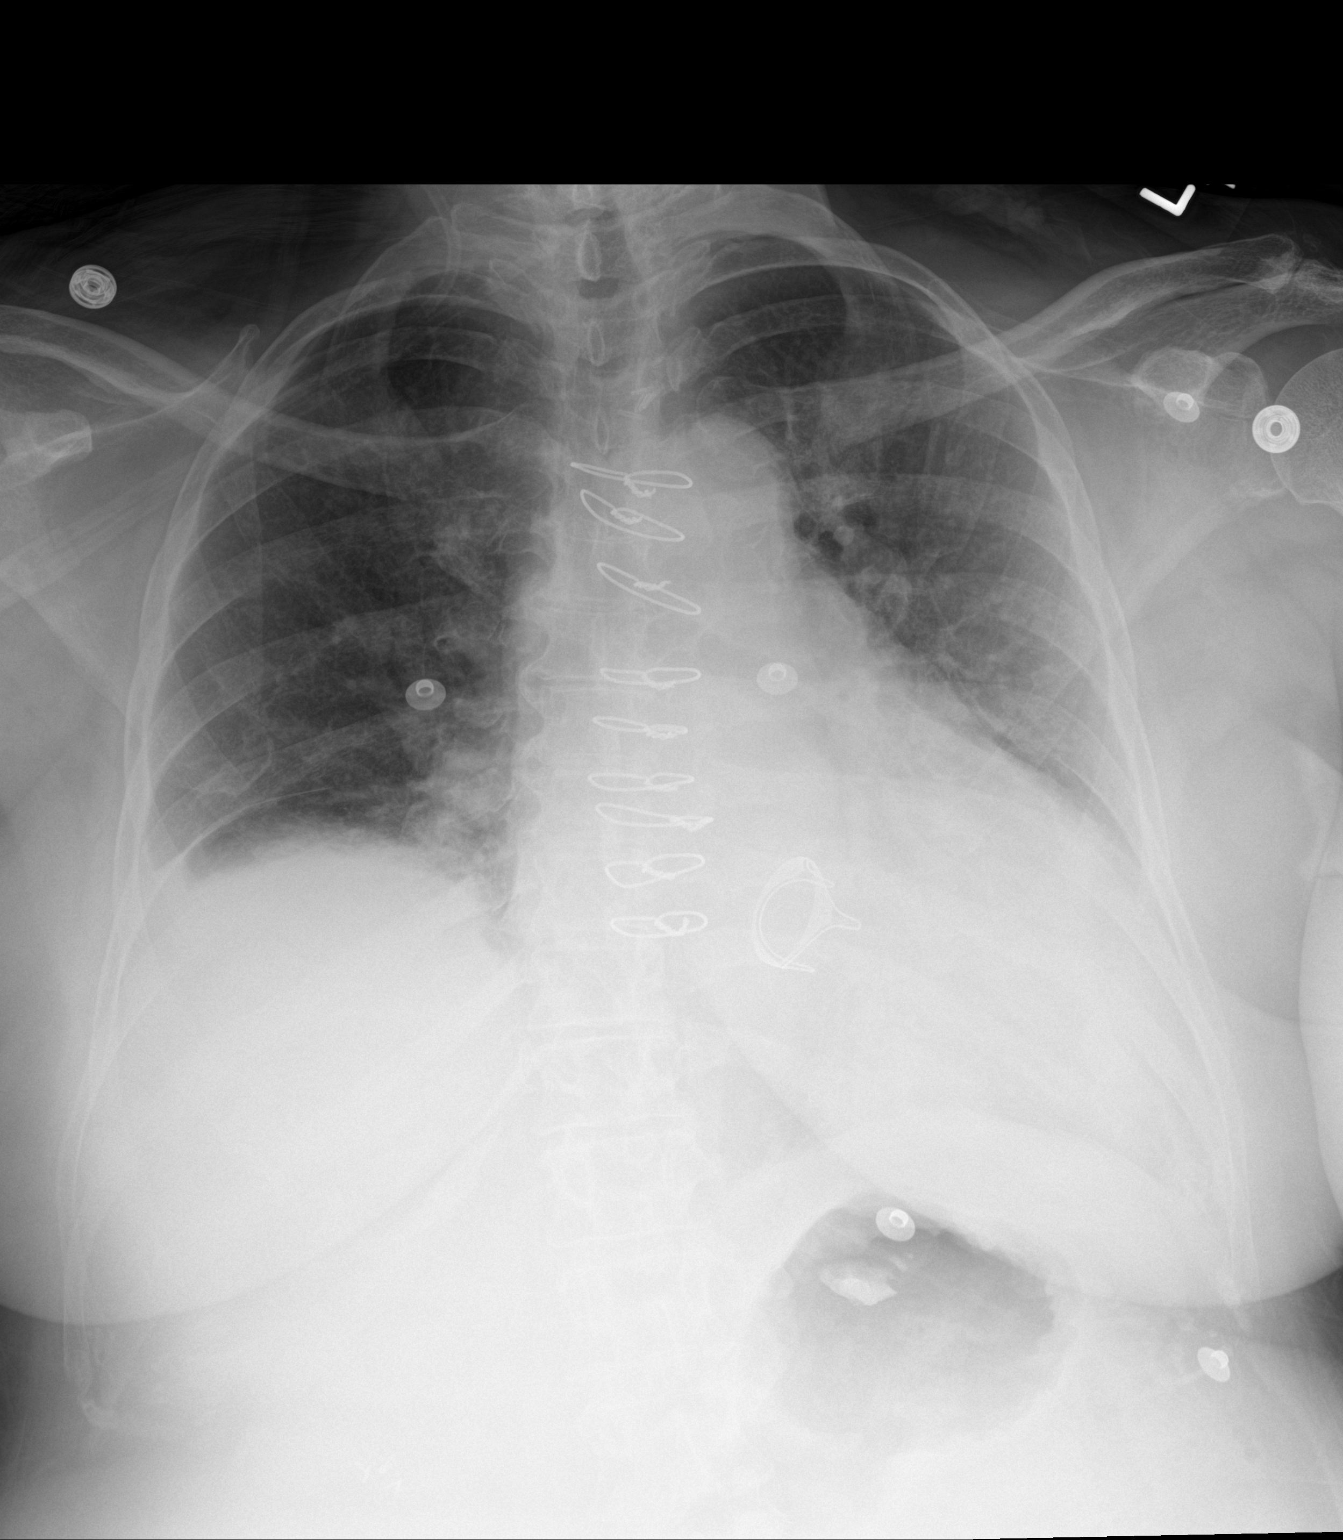

[2 of 2 positions shown; findings below may reference images not displayed]

FINDINGS: There is elevation of the right hemidiaphragm. A small right pleural
effusion is noted. Mild bibasilar airspace opacities could reflect
minimal interstitial edema. Underlying vascular congestion is noted.
No pneumothorax is seen.

The heart is borderline enlarged. The patient is status post median
sternotomy. A valve replacement is noted. No acute osseous
abnormalities are identified.
IMPRESSION: Elevation of the right hemidiaphragm, with small right pleural
effusion. Mild bibasilar airspace opacities could reflect minimal
interstitial edema. Underlying vascular congestion and borderline
cardiomegaly.

## 2021-05-13 DEATH — deceased
